# Patient Record
Sex: Female | Born: 1975 | Race: Black or African American | Hispanic: No | Marital: Single | Smoking: Never smoker
Health system: Southern US, Community
[De-identification: ages and names within clinical notes are randomized; demographics above are authoritative.]

## PROBLEM LIST (undated history)

## (undated) DIAGNOSIS — Z789 Other specified health status: Secondary | ICD-10-CM

---

## 2017-08-26 ENCOUNTER — Ambulatory Visit (HOSPITAL_COMMUNITY)
Admission: EM | Admit: 2017-08-26 | Discharge: 2017-08-26 | Discharge status: Against Medical Advice | Payer: BLUE CROSS/BLUE SHIELD | Attending: Family Medicine | Admitting: Family Medicine

## 2017-08-26 ENCOUNTER — Encounter (HOSPITAL_COMMUNITY): Payer: Self-pay

## 2017-08-26 ENCOUNTER — Emergency Department (HOSPITAL_COMMUNITY)
Admission: EM | Admit: 2017-08-26 | Discharge: 2017-08-28 | Disposition: A | Discharge status: Other Acute Inpatient Hospital | Payer: BLUE CROSS/BLUE SHIELD | Attending: Emergency Medicine | Admitting: Emergency Medicine

## 2017-08-26 ENCOUNTER — Emergency Department (HOSPITAL_COMMUNITY): Payer: BLUE CROSS/BLUE SHIELD

## 2017-08-26 DIAGNOSIS — F200 Paranoid schizophrenia: Secondary | ICD-10-CM | POA: Diagnosis present

## 2017-08-26 DIAGNOSIS — K625 Hemorrhage of anus and rectum: Secondary | ICD-10-CM | POA: Diagnosis present

## 2017-08-26 DIAGNOSIS — D509 Iron deficiency anemia, unspecified: Secondary | ICD-10-CM

## 2017-08-26 DIAGNOSIS — F22 Delusional disorders: Secondary | ICD-10-CM

## 2017-08-26 DIAGNOSIS — F209 Schizophrenia, unspecified: Secondary | ICD-10-CM

## 2017-08-26 LAB — CBC WITH DIFFERENTIAL/PLATELET
ABS IMMATURE GRANULOCYTES: 0 10*3/uL (ref 0.0–0.1)
BASOS ABS: 0 10*3/uL (ref 0.0–0.1)
Basophils Relative: 1 %
Eosinophils Absolute: 0.1 10*3/uL (ref 0.0–0.7)
Eosinophils Relative: 2 %
HCT: 34.6 % — ABNORMAL LOW (ref 36.0–46.0)
HEMOGLOBIN: 9.4 g/dL — AB (ref 12.0–15.0)
Immature Granulocytes: 0 %
LYMPHS PCT: 29 %
Lymphs Abs: 1.2 10*3/uL (ref 0.7–4.0)
MCH: 19.5 pg — ABNORMAL LOW (ref 26.0–34.0)
MCHC: 27.2 g/dL — ABNORMAL LOW (ref 30.0–36.0)
MCV: 71.9 fL — ABNORMAL LOW (ref 78.0–100.0)
Monocytes Absolute: 0.3 10*3/uL (ref 0.1–1.0)
Monocytes Relative: 6 %
NEUTROS ABS: 2.6 10*3/uL (ref 1.7–7.7)
NEUTROS PCT: 62 %
Platelets: 521 10*3/uL — ABNORMAL HIGH (ref 150–400)
RBC: 4.81 MIL/uL (ref 3.87–5.11)
RDW: 18.9 % — ABNORMAL HIGH (ref 11.5–15.5)
WBC: 4.1 10*3/uL (ref 4.0–10.5)

## 2017-08-26 LAB — COMPREHENSIVE METABOLIC PANEL
ALBUMIN: 4.9 g/dL (ref 3.5–5.0)
ALT: 15 U/L (ref 0–44)
AST: 31 U/L (ref 15–41)
Alkaline Phosphatase: 30 U/L — ABNORMAL LOW (ref 38–126)
Anion gap: 12 (ref 5–15)
BUN: 11 mg/dL (ref 6–20)
CHLORIDE: 103 mmol/L (ref 98–111)
CO2: 23 mmol/L (ref 22–32)
CREATININE: 0.85 mg/dL (ref 0.44–1.00)
Calcium: 10 mg/dL (ref 8.9–10.3)
GFR calc Af Amer: >60 mL/min (ref >60)
GLUCOSE: 96 mg/dL (ref 70–99)
Potassium: 4.1 mmol/L (ref 3.5–5.1)
Sodium: 138 mmol/L (ref 135–145)
Total Bilirubin: 0.9 mg/dL (ref 0.3–1.2)
Total Protein: 8.1 g/dL (ref 6.5–8.1)

## 2017-08-26 LAB — URINALYSIS, ROUTINE W REFLEX MICROSCOPIC
Bilirubin Urine: NEGATIVE
GLUCOSE, UA: NEGATIVE mg/dL
Hgb urine dipstick: NEGATIVE
Ketones, ur: 80 mg/dL — AB
LEUKOCYTES UA: NEGATIVE
NITRITE: NEGATIVE
PH: 5 (ref 5.0–8.0)
PROTEIN: NEGATIVE mg/dL
Specific Gravity, Urine: 1.025 (ref 1.005–1.030)

## 2017-08-26 LAB — RAPID URINE DRUG SCREEN, HOSP PERFORMED
Amphetamines: NOT DETECTED
BARBITURATES: NOT DETECTED
Benzodiazepines: NOT DETECTED
COCAINE: NOT DETECTED
OPIATES: NOT DETECTED
Tetrahydrocannabinol: NOT DETECTED

## 2017-08-26 LAB — I-STAT BETA HCG BLOOD, ED (MC, WL, AP ONLY)

## 2017-08-26 LAB — ETHANOL: Alcohol, Ethyl (B): <10 mg/dL (ref <10)

## 2017-08-26 LAB — GROUP A STREP BY PCR: GROUP A STREP BY PCR: NOT DETECTED

## 2017-08-26 MED ORDER — ONDANSETRON HCL 4 MG PO TABS: 4.0000 mg | ORAL_TABLET | Freq: Three times a day (TID) | ORAL | Status: DC | PRN

## 2017-08-26 MED ORDER — STERILE WATER FOR INJECTION IJ SOLN
INTRAMUSCULAR | Status: AC
  Administered 2017-08-26: 2.1 mL
  Filled 2017-08-26: qty 10

## 2017-08-26 MED ORDER — IBUPROFEN 400 MG PO TABS: 600.0000 mg | ORAL_TABLET | Freq: Three times a day (TID) | ORAL | Status: DC | PRN

## 2017-08-26 MED ORDER — ZOLPIDEM TARTRATE 5 MG PO TABS: 5.0000 mg | ORAL_TABLET | Freq: Every evening | ORAL | Status: DC | PRN

## 2017-08-26 MED ORDER — ALUM & MAG HYDROXIDE-SIMETH 200-200-20 MG/5ML PO SUSP: 30.0000 mL | Freq: Four times a day (QID) | ORAL | Status: DC | PRN

## 2017-08-26 MED ORDER — LORAZEPAM 1 MG PO TABS: 1.0000 mg | ORAL_TABLET | ORAL | Status: DC | PRN

## 2017-08-26 MED ORDER — NICOTINE 21 MG/24HR TD PT24: 21.0000 mg | MEDICATED_PATCH | Freq: Every day | TRANSDERMAL | Status: DC

## 2017-08-26 MED ORDER — OLANZAPINE 5 MG PO TBDP: 5.0000 mg | ORAL_TABLET | Freq: Three times a day (TID) | ORAL | Status: DC | PRN

## 2017-08-26 MED ORDER — ZIPRASIDONE MESYLATE 20 MG IM SOLR
20.0000 mg | Freq: Once | INTRAMUSCULAR | Status: AC
  Administered 2017-08-26: 20 mg via INTRAMUSCULAR
  Filled 2017-08-26: qty 20

## 2017-08-26 NOTE — ED Notes (Signed)
Unit process and visiting hours discussed with pt and family. Family given extra copy of process and same at pt bedsdie. mFamiily states they understand but pt continues to ask same questions.

## 2017-08-26 NOTE — ED Provider Notes (Signed)
Patient placed in Quick Look pathway, seen and evaluated   Chief Complaint: sore throat and for rectal bleeding and burning and wants resources for therapist/psychiatrist to deal with stressors in her life.   HPI:   Sore throat started yesterday. Chills. Also noted blood when wiping rectum after bowel movement over several times a day. Reports pain to the rectum. No vaginal discharge or bleeding. States she would like to get mental help. States she needs a psychopsychiatrist.   ROS: sore throat, rectal pain, paranoid behavior  Physical Exam:   Gen: No distress  Neuro: Awake and Alert  Skin: Warm    Focused Exam: pt with tangential speech, paranoid behavior, denis SI or HI, but feels like someone is following her   Initiation of care has begun. The patient has been counseled on the process, plan, and necessity for staying for the completion/evaluation, and the remainder of the medical screening examination   Pt refusing blood work or any testing. Sister at bedside requesing pt to be see by psychiatrist. Pt with tangential speech, paranoid, speaking about signs and wires hanging down in the shospital, states aftraid she is going to be taken away from daughter. States she feels like people are spying on her through [employer]. Keeps repeating "I need to get checked for STD."  Sister states she she is worried about her safety and safety of her daughter. States she drove with her daughter all night bc she was running away from someone. States she feels like people spying on her from tv screens and refusing to speak with tts via skype for that reason   IVC pappers taken. Pt danger to herself and others. She refuses me to examine her in this room. Will need more thorough exam.    Jaynie CrumbleKirichenko, Yafet Cline, PA-C 08/26/17 1445    Arby BarrettePfeiffer, Marcy, MD 08/28/17 [phone]
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
## 2017-08-26 NOTE — ED Notes (Signed)
Pt to ct with RN and sitter.

## 2017-08-26 NOTE — ED Notes (Signed)
Pt rouses briefly and agrees to CT scan.

## 2017-08-26 NOTE — Progress Notes (Signed)
Pt accepted [facility] main [facility] Dr. Estill Cottahomas Cornwall is the attending/accepting provider.  Call report to [phone] [facility] notified.    Pt is involuntary and will need to be transported by law enforcement.  Pt is scheduled to [facility] 08/27/17 in the AM.  Wells GuilesSarah Aleksis Jiggetts, LCSW, LCAS Disposition CSW [facility]/TTS [phone]

## 2017-08-26 NOTE — BH Assessment (Addendum)
[facility] Assessment Progress Note  Pt has been served her IVC paperwork and continues to refuse to comply with any protocol (labwork, changing clothes, etc). LE and RN are attempting to get pt to comply and have been for the last 30 or so minutes. TTS assessment unable to be completed at this time. TTS clock stopped.  Johny ShockSamantha M. Ladona Ridgelaylor, MS, NCC, LPCA Counselor

## 2017-08-26 NOTE — ED Notes (Addendum)
Pt returns from CT. Pt remains rousable and moves self back from scanner to stretcher

## 2017-08-26 NOTE — ED Notes (Signed)
Patient attempted to walk out - Patient now IVC'd - [employer] officer brought patient back to triage area. Patient is calm, speaking softly, but requesting to be seen by a doctor and wanting to know why she hasn't been examined yet. Quick look provider speaking with patient at this time.

## 2017-08-26 NOTE — ED Notes (Signed)
Pt continues speaking in circular argument. Dr. Criss AlvineGoldston made aware that pt will not comply with process.

## 2017-08-26 NOTE — ED Notes (Signed)
Pt sleeping at intervals but rouses to voic e ad touch.

## 2017-08-26 NOTE — Progress Notes (Signed)
Pt meets inpatient criteria per Assunta FoundShuvon Rankin, NP. Referral information has been sent to the following hospitals for review: [facility]  [facility]  [facility]  [facility]  [facility]  [facility]  [facility]  [facility]   CSW will continue to assist with placement needs.   Wells GuilesSarah Uniqua Kihn, LCSW, LCAS Disposition CSW [facility] [phone]
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
## 2017-08-26 NOTE — ED Notes (Signed)
Belongings inventoried and placed in locker #2 and valuables sent to [employer]; Patient has $104.67 with Security-Monique,RN
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
## 2017-08-26 NOTE — ED Notes (Signed)
Pt escorted to room Fpod 10. Sister at side. Pt insttructed to change into scrubs and informed after change we would allow family at bedsdie to go over unit policies. Pt agrees then begins asking question of who did papers. Pt also states this is a staged area of the hospital and she wants to go where there are o0ther patients. Pt repeatedly states she will remove clothing but then ask another question. Pt is calm but continues to ask circular questions and states she does not believe this nurse is a Actuaryreal healthcare professional.

## 2017-08-26 NOTE — ED Triage Notes (Signed)
Pt presents for evaluation of sore throat starting last night. Pt reports concern over STD exposure but denies vaginal symptoms. States she had some rectal bleeding when wiping. Denies blood in the toilet.

## 2017-08-26 NOTE — ED Triage Notes (Signed)
Per pt access, pt left 

## 2017-08-26 NOTE — ED Notes (Signed)
Dr. Kathryne SharperGo;dston speaking with family.

## 2017-08-26 NOTE — ED Notes (Signed)
Dr. Goldston at bedside.  

## 2017-08-27 DIAGNOSIS — K625 Hemorrhage of anus and rectum: Secondary | ICD-10-CM | POA: Diagnosis present

## 2017-08-27 DIAGNOSIS — F200 Paranoid schizophrenia: Secondary | ICD-10-CM

## 2017-08-27 DIAGNOSIS — F209 Schizophrenia, unspecified: Secondary | ICD-10-CM

## 2017-08-27 DIAGNOSIS — D509 Iron deficiency anemia, unspecified: Secondary | ICD-10-CM

## 2017-08-27 LAB — CBC
HCT: 31.5 % — ABNORMAL LOW (ref 36.0–46.0)
HEMATOCRIT: 31.5 % — AB (ref 36.0–46.0)
HEMATOCRIT: 31.2 % — AB (ref 36.0–46.0)
HEMOGLOBIN: 8.5 g/dL — AB (ref 12.0–15.0)
HEMOGLOBIN: 8.5 g/dL — AB (ref 12.0–15.0)
Hemoglobin: 8.4 g/dL — ABNORMAL LOW (ref 12.0–15.0)
MCH: 19.5 pg — ABNORMAL LOW (ref 26.0–34.0)
MCH: 19.5 pg — ABNORMAL LOW (ref 26.0–34.0)
MCH: 19.6 pg — AB (ref 26.0–34.0)
MCHC: 27 g/dL — ABNORMAL LOW (ref 30.0–36.0)
MCHC: 26.9 g/dL — ABNORMAL LOW (ref 30.0–36.0)
MCHC: 27 g/dL — AB (ref 30.0–36.0)
MCV: 72.1 fL — AB (ref 78.0–100.0)
MCV: 72.6 fL — AB (ref 78.0–100.0)
MCV: 72.6 fL — ABNORMAL LOW (ref 78.0–100.0)
PLATELETS: 468 10*3/uL — AB (ref 150–400)
Platelets: 436 10*3/uL — ABNORMAL HIGH (ref 150–400)
Platelets: 419 10*3/uL — ABNORMAL HIGH (ref 150–400)
RBC: 4.37 MIL/uL (ref 3.87–5.11)
RBC: 4.3 MIL/uL (ref 3.87–5.11)
RBC: 4.34 MIL/uL (ref 3.87–5.11)
RDW: 18.8 % — ABNORMAL HIGH (ref 11.5–15.5)
RDW: 18.8 % — AB (ref 11.5–15.5)
RDW: 18.9 % — ABNORMAL HIGH (ref 11.5–15.5)
WBC: 3.9 10*3/uL — AB (ref 4.0–10.5)
WBC: 3.8 10*3/uL — AB (ref 4.0–10.5)
WBC: 3.3 10*3/uL — ABNORMAL LOW (ref 4.0–10.5)

## 2017-08-27 LAB — RAPID HIV SCREEN (HIV 1/2 AB+AG)
HIV 1/2 Antibodies: NONREACTIVE
HIV-1 P24 Antigen - HIV24: NONREACTIVE

## 2017-08-27 LAB — HIV ANTIBODY (ROUTINE TESTING W REFLEX): HIV SCREEN 4TH GENERATION: NONREACTIVE

## 2017-08-27 LAB — ABO/RH: ABO/RH(D): B POS

## 2017-08-27 LAB — APTT: aPTT: 28 seconds (ref 24–36)

## 2017-08-27 LAB — PROTIME-INR
INR: 1.14
Prothrombin Time: 14.5 seconds (ref 11.4–15.2)

## 2017-08-27 MED ORDER — ONDANSETRON HCL 4 MG/2ML IJ SOLN: 4.0000 mg | Freq: Three times a day (TID) | INTRAMUSCULAR | Status: DC | PRN

## 2017-08-27 MED ORDER — SODIUM CHLORIDE 0.9 % IV SOLN
INTRAVENOUS | Status: DC
  Administered 2017-08-27: 05:00:00 via INTRAVENOUS

## 2017-08-27 MED ORDER — RISPERIDONE 1 MG PO TABS
1.0000 mg | ORAL_TABLET | Freq: Two times a day (BID) | ORAL | Status: DC
  Administered 2017-08-27 – 2017-08-28 (×3): 1 mg via ORAL
  Filled 2017-08-27 (×3): qty 1

## 2017-08-27 MED ORDER — LORAZEPAM 2 MG/ML IJ SOLN: 0.5000 mg | Freq: Four times a day (QID) | INTRAMUSCULAR | Status: DC | PRN

## 2017-08-27 MED ORDER — PANTOPRAZOLE SODIUM 40 MG IV SOLR
40.0000 mg | Freq: Two times a day (BID) | INTRAVENOUS | Status: DC
  Administered 2017-08-27: 40 mg via INTRAVENOUS
  Filled 2017-08-27 (×2): qty 40

## 2017-08-27 NOTE — ED Notes (Signed)
IVC explained to pt

## 2017-08-27 NOTE — Progress Notes (Signed)
Patient to be admitted to inpatient due to rectal bleeding and dropping hemoglobin stats.  Disposition CSW will mark consult as complete.  Medical staff may resubmit TTS consult if and when it is appropriate and patient is medically cleared.  Timmothy EulerJean T. Kaylyn LimSutter, MSW, LCSWA Disposition Clinical Social Work [phone] (office)

## 2017-08-27 NOTE — ED Notes (Signed)
Pt talking on phone with work, requested to speak with off-duty police officer

## 2017-08-27 NOTE — ED Notes (Signed)
[facility] be medically admitted to the hospital; RN also spoke [facility] to inform them patient will not be transported there-Monique,RN

## 2017-08-27 NOTE — ED Notes (Signed)
ED Provider at bedside. 

## 2017-08-27 NOTE — BH Assessment (Signed)
Met with pt for reassessment. Pt states she does not need any help. She reports she was not seen by a behavioral health counselor for an assessment yesterday. Pt denies having custody issues with her child's father. She reports having only stress that an average single mother would endure. Pt reports missing some work recently & states she wants to thank her employer but denies any employment problems or stressors. Pt denies SI and HI. She denies AVH, and questioned why this kind of question would be asked of her. Pt reports her sister is making up lies about her. Pt admits her mother is concerned about her.  Pt states her mother would not be a helpful person for this writer to get collateral information from because she is a mother and they are always concerned. Pt states she came to ED to get a referral to a psychotherapist to address average single mother stress. Pt was evasive in answering most questions. She denied changes in her sleep but stated she could not say her average number of hours slept.  Pt stated she loses wt every summer & gains every winter but would not estimate recent summer loss amount. Pt is oriented to time, person & place. Pt states she is not paranoid. Pt reported people "may" have been following her recently. When asked why she suspected them of following her, she reported it was their odd behaviors. Pt was evasive in answering questions re: if she thought her phone was tapped & home was bugged.  Shuvon Rankin, NP continues to recommend psychiatric hospitalization.

## 2017-08-27 NOTE — Progress Notes (Signed)
Disposition CSW was contacted by patient's nurse, Hoy RegisterBrianne Davis, RN who related that admitting MD has decided that patient is medically stable and cleared.  CSW called [facility] to see if patient bed was still available and was told it was not and they would have to put patient on their waiting list.  CSW reinstated patient consult so she can be re-evaluated.  Timmothy EulerJean T. Kaylyn LimSutter, MSW, LCSWA Disposition Clinical Social Work [phone])

## 2017-08-27 NOTE — H&P (Signed)
History and Physical  [facility] ZOX:[id number] DOB: 04-08-1975 DOA: 08/26/2017  PCP: Patient, No Pcp Per   Chief Complaint: sore throat  HPI:  Patient interviewed with bedside nurse Hoy RegisterBrianne Davis present 100% of interaction.  Per chart review 42 year old woman presented 8/20 1 in the afternoon with multiple complaints including sore throat, STD, rectal bleeding, desire to see psychiatrist for stressors in her life.  Initially refused blood work, per EDP documentation patient with tangential speech, paranoid, hallucinating, reporting people were spying on her through electronics.  Again per chart, it is documented that the patient's sister was worried about her safety.  IVC papers were drawn up and the patient was felt to be a danger to herself and others.  She refused initial examination.  A second EDP also evaluated the patient and noted her speech to be tangential, with paranoid and delusional thought content.  She did not allow an examination by second EDP.  Was evaluated by psychiatry team with diagnosis of paranoid schizophrenia and recommendation for inpatient treatment.  [facility].  Only abnormality noted was anemia and plans were made to repeat to exclude acute drop.  Third EDP noted modest change in hemoglobin and called hospitalist team for admission, patient was accepted by my partner.  However subsequent hemoglobins have remained stable.  Per bedside RN in the emergency department, the patient has been calm but does not want any further evaluation..   On my interview with the patient she denies any complaints, she denies abdominal pain specifically, bleeding.  Her review of systems is negative and she would prefer any work-up to be done as an outpatient.  She steadfastly refuses examination of any kind including basic heart and lung exam.  "I am fine."  She denies suicidal or homicidal ideation, she denies hallucinations.  She reports psychiatric concerns as  being "made up".  ED Course: Afebrile, vital signs stable.  Treated with Geodon.  Review of Systems:  Negative for fever, visual changes, sore throat, rash, new muscle aches, chest pain, SOB, dysuria, bleeding, n/v/abdominal pain.  History reviewed. No pertinent past medical history.  History reviewed. No pertinent surgical history.   has no tobacco, alcohol, and drug history on file. Mobility: Ambulatory  No Known Allergies  No family history on file.  Noncompliant with questioning Prior to Admission medications   Not on File    Physical Exam: Vitals:   08/27/17 0309 08/27/17 0615  BP: 120/81 125/88  Pulse: (!) 111 84  Resp:    Temp:  98.7 F (37.1 C)  SpO2:  99%    Constitutional:   . Appears calm and comfortable Eyes:  Marland Kitchen. Appear grossly unremarkable ENMT:  . grossly normal hearing  Neck:  . Appears grossly unremarkable Respiratory:  . Respiratory effort appears normal. Cardiovascular:  . Refuses examination Abdomen:  . Refuses examination Musculoskeletal:  . Refuses examination but does move all limbs to request. Skin:  . Refuses examination Neurologic:  . Refuses examination Psychiatric:  . Mental status o Mood difficult to assess, affect odd o Oriented to person, place, time . judgment and insight appear impaired  As the patient refused any examination of any kind, no physical contact whatsoever was made with patient. Hoy RegisterBrianne Davis present for entire interview.  I have personally reviewed following labs and imaging studies  Labs:   Hemoglobin 9.4, 8.5, 8.4, 8.5.  Microcytosis noted.  WBC 3.3-4.1.  Platelets within normal limits.  HIV nonreactive  Urine pregnancy negative  Imaging studies:  CT head no acute findings  Medical tests:   EKG independently reviewed: Sinus rhythm, no acute changes.  Nonspecific ST changes.   Principal Problem:   Paranoid schizophrenia (HCC) Active Problems:   Microcytic  anemia   Assessment/Plan Paranoid schizophrenia per psychiatry evaluation. S/p Geodon in ED may account for calmness. --Inpatient treatment as recommended by psychiatry.  Microcytic anemia, stable over several blood draws, almost certainly chronic in nature from menses.  Completely asymptomatic.  Normotensive.  Normal heart rate.  Reports mostly regular menses, not currently menstruating.  Refuses any examination.  Denies rectal bleeding. --She has a microcytic anemia, suspect related to menstrual cycle.  No previous blood work available, she does follow with her GYN but cannot recall the name or the name of the practice and there is no blood work available in care everywhere. --Subsequent hemoglobins have been stable.  Hemoglobin can vary up to 1 g on subsequent testing and the initial change from 9.4-8.5 is not significant given the clinical picture. --Given the fact that she is asymptomatic, normotensive, not tachycardic and refuses physical examination as well as further testing, I do not think it is reasonable to pursue any further work-up.  In fact regardless this patient is stable from a medical standpoint for outpatient follow-up and should follow-up with her PCP or GYN within the next 2 weeks.  Empiric trial of oral iron at some point would be recommended.  Reported request for STD testing.  I discussed what this would entail with the patient including pelvic examination.  She wishes to defer this to the outpatient setting.  I see no compelling reason to pursue this during this evaluation.  As documented above, notwithstanding the limitations I have described, the patient appears stable from a medical perspective and further evaluation can be pursued in the outpatient setting.  The most immediate concern is paranoid schizophrenia and I believe it is in the patient's best interest to proceed with inpatient psychiatric treatment.  Unfortunately at this point patient has lost psychiatric bed.  Will continue observation pending CSW securement of new psychiatric bed.  Severity of Illness: The appropriate patient status for this patient is OBSERVATION. Observation status is judged to be reasonable and necessary in order to provide the required intensity of service to ensure the patient's safety. The patient's presenting symptoms, physical exam findings, and initial radiographic and laboratory data in the context of their medical condition is felt to place them at decreased risk for further clinical deterioration. Furthermore, it is anticipated that the patient will be medically stable for discharge from the hospital within 2 midnights of admission.    DVT prophylaxis: ambulation Code Status: Full Family Communication:  Consults called: psychiatory    Time spent: 17 minutes  Brendia Sacks, MD  [facility] Direct contact: [phone] --Via amion app OR  --[email or web address]; password [email or web address]  7PM-7AM contact night coverage as above  08/27/2017, 11:45 AM

## 2017-08-27 NOTE — ED Notes (Signed)
Hospitalist at bedside 

## 2017-08-27 NOTE — ED Notes (Signed)
Pt wants to discuss her child's custody. Pt needs to contact family member and wants to discuss w/ counselor

## 2017-08-27 NOTE — ED Notes (Signed)
Pt's mother contact [name] [phone]),

## 2017-08-27 NOTE — Progress Notes (Signed)
Pt continues to meet inpatient criteria. She has been referred to the following hospitals for review: [facility]  [facility]  [facility]  [facility]  [facility]  [facility]  [facility]  [facility]   CSW will continue to assist with placement needs  Bethanne GingerSarah Adream Parzych, LCSW, LCAS Disposition CSW [facility] [phone]

## 2017-08-27 NOTE — Care Management (Signed)
This is a no charge note  Pending admission per Dr. Preston FleetingGlick  42 year old lady without significant past medical history, who presents with multiple complaints, including rectal bleeding, sore throat, stress life.  Patient is paranoid.  TTS was consulted. She noted blood when wiping rectum after bowel movement. She also wants to be tested for STD. No vaginal discharge or bleeding.  No baseline hemoglobin available.  Hemoglobin dropped from a 9.4 to 8.5 in 8 hours in ED.  Rapid stress test negative.  Currently hemodynamically stable. Pt is placed in tele bed for obs.   Kelsey HarpXilin Tyja Gortney, MD  [facility] Pager [phone]  If 7PM-7AM, please contact night-coverage [email or web address] Password [email or web address] 08/27/2017, 3:45 AM
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
## 2017-08-27 NOTE — ED Notes (Signed)
Visitor here for pt, security en route to wand \

## 2017-08-27 NOTE — ED Notes (Signed)
Admitting Paged about status of admit order.

## 2017-08-27 NOTE — ED Notes (Addendum)
Pt believes Np to come reassess her in person, I contacted Shuvon Rankin who is talking to pt on the phone

## 2017-08-27 NOTE — ED Notes (Addendum)
Pt's mother at bedside for visitation.

## 2017-08-27 NOTE — ED Notes (Signed)
[facility]

## 2017-08-27 NOTE — ED Notes (Signed)
Pt Ph call #1 w/ sister [name] & Pt's dtr

## 2017-08-28 ENCOUNTER — Encounter (HOSPITAL_COMMUNITY): Payer: Self-pay | Admitting: *Deleted

## 2017-08-28 ENCOUNTER — Other Ambulatory Visit: Payer: Self-pay

## 2017-08-28 ENCOUNTER — Inpatient Hospital Stay (HOSPITAL_COMMUNITY)
Admission: AD | Admit: 2017-08-28 | Discharge: 2017-09-04 | DRG: 881 | Disposition: A | Discharge status: Home / Self Care | Payer: BLUE CROSS/BLUE SHIELD | Attending: Psychiatry | Admitting: Psychiatry

## 2017-08-28 DIAGNOSIS — F329 Major depressive disorder, single episode, unspecified: Principal | ICD-10-CM | POA: Diagnosis present

## 2017-08-28 DIAGNOSIS — D509 Iron deficiency anemia, unspecified: Secondary | ICD-10-CM | POA: Diagnosis present

## 2017-08-28 DIAGNOSIS — R451 Restlessness and agitation: Secondary | ICD-10-CM | POA: Diagnosis not present

## 2017-08-28 DIAGNOSIS — Z560 Unemployment, unspecified: Secondary | ICD-10-CM | POA: Diagnosis not present

## 2017-08-28 DIAGNOSIS — F200 Paranoid schizophrenia: Secondary | ICD-10-CM | POA: Diagnosis not present

## 2017-08-28 DIAGNOSIS — F209 Schizophrenia, unspecified: Secondary | ICD-10-CM | POA: Diagnosis not present

## 2017-08-28 DIAGNOSIS — G4700 Insomnia, unspecified: Secondary | ICD-10-CM | POA: Diagnosis not present

## 2017-08-28 DIAGNOSIS — D649 Anemia, unspecified: Secondary | ICD-10-CM | POA: Diagnosis not present

## 2017-08-28 DIAGNOSIS — F410 Panic disorder [episodic paroxysmal anxiety] without agoraphobia: Secondary | ICD-10-CM | POA: Diagnosis present

## 2017-08-28 DIAGNOSIS — G259 Extrapyramidal and movement disorder, unspecified: Secondary | ICD-10-CM | POA: Diagnosis not present

## 2017-08-28 DIAGNOSIS — D6489 Other specified anemias: Secondary | ICD-10-CM | POA: Diagnosis not present

## 2017-08-28 DIAGNOSIS — F419 Anxiety disorder, unspecified: Secondary | ICD-10-CM | POA: Diagnosis not present

## 2017-08-28 HISTORY — DX: Other specified health status: Z78.9

## 2017-08-28 MED ORDER — LORAZEPAM 1 MG PO TABS: 2.0000 mg | ORAL_TABLET | Freq: Four times a day (QID) | ORAL | Status: DC | PRN

## 2017-08-28 MED ORDER — HALOPERIDOL LACTATE 5 MG/ML IJ SOLN: 5.0000 mg | Freq: Four times a day (QID) | INTRAMUSCULAR | Status: DC | PRN

## 2017-08-28 MED ORDER — DIPHENHYDRAMINE HCL 50 MG/ML IJ SOLN: 50.0000 mg | Freq: Four times a day (QID) | INTRAMUSCULAR | Status: DC | PRN

## 2017-08-28 MED ORDER — DIPHENHYDRAMINE HCL 25 MG PO CAPS: 25.0000 mg | ORAL_CAPSULE | Freq: Four times a day (QID) | ORAL | Status: DC | PRN

## 2017-08-28 MED ORDER — HYDROXYZINE HCL 25 MG PO TABS: 25.0000 mg | ORAL_TABLET | Freq: Three times a day (TID) | ORAL | Status: DC | PRN

## 2017-08-28 MED ORDER — LORAZEPAM 2 MG/ML IJ SOLN: 2.0000 mg | Freq: Four times a day (QID) | INTRAMUSCULAR | Status: DC | PRN

## 2017-08-28 MED ORDER — ACETAMINOPHEN 325 MG PO TABS: 650.0000 mg | ORAL_TABLET | Freq: Four times a day (QID) | ORAL | Status: DC | PRN

## 2017-08-28 MED ORDER — ALUM & MAG HYDROXIDE-SIMETH 200-200-20 MG/5ML PO SUSP: 30.0000 mL | ORAL | Status: DC | PRN

## 2017-08-28 MED ORDER — TRAZODONE HCL 50 MG PO TABS: 50.0000 mg | ORAL_TABLET | Freq: Every evening | ORAL | Status: DC | PRN

## 2017-08-28 MED ORDER — HYDROXYZINE HCL 50 MG PO TABS: 50.0000 mg | ORAL_TABLET | Freq: Four times a day (QID) | ORAL | Status: DC | PRN

## 2017-08-28 MED ORDER — BENZTROPINE MESYLATE 1 MG/ML IJ SOLN: 1.0000 mg | Freq: Two times a day (BID) | INTRAMUSCULAR | Status: DC | PRN

## 2017-08-28 MED ORDER — MAGNESIUM HYDROXIDE 400 MG/5ML PO SUSP: 30.0000 mL | Freq: Every day | ORAL | Status: DC | PRN

## 2017-08-28 MED ORDER — HALOPERIDOL 5 MG PO TABS: 5.0000 mg | ORAL_TABLET | Freq: Four times a day (QID) | ORAL | Status: DC | PRN

## 2017-08-28 MED ORDER — DIPHENHYDRAMINE HCL 25 MG PO CAPS: 50.0000 mg | ORAL_CAPSULE | Freq: Four times a day (QID) | ORAL | Status: DC | PRN

## 2017-08-28 MED ORDER — BENZTROPINE MESYLATE 1 MG PO TABS: 1.0000 mg | ORAL_TABLET | Freq: Two times a day (BID) | ORAL | Status: DC | PRN

## 2017-08-28 NOTE — ED Notes (Signed)
[facility]   By Kandace Parkinsakia starkes,NP & Earvin Hansenhristopher [place],MD

## 2017-08-28 NOTE — ED Notes (Signed)
Pt gave RN a letter she wants to discuss w/ her sup., Letter attached to Pt's clipboard F10

## 2017-08-28 NOTE — ED Notes (Signed)
Pt explained she was pacing, trying to keep herself awake to "gather her thoughts." Pen and paper provided so pt could write thoughts down.

## 2017-08-28 NOTE — ED Notes (Signed)
[employer] notified, Pt needs transport

## 2017-08-28 NOTE — ED Notes (Signed)
Pt  Ph Call #1

## 2017-08-28 NOTE — ED Notes (Signed)
  [employer] to transport Pt

## 2017-08-28 NOTE — Progress Notes (Signed)
[facility] Bed [facility]2 NP is the accepting provider.  Dr. is the attending provider.  Call report to [phone]  Bri, [facility] @ [facility] notified.   Pt is IVC   Pt may be transported by [employer] [facility]  [facility] as [facility] can be arranged.  Kelsey EulerJean T. Kaylyn LimSutter, MSW, LCSWA Disposition Clinical Social Work [phone] (cell) [phone] (office)

## 2017-08-28 NOTE — ED Notes (Signed)
Pt walking in hallway.

## 2017-08-28 NOTE — Tx Team (Signed)
Initial Treatment Plan 08/28/2017 1:34 PM [facility] ZOX:[id number]    PATIENT STRESSORS: Marital or family conflict Occupational concerns   PATIENT STRENGTHS: Average or above average intelligence Communication skills General fund of knowledge Supportive family/friends   PATIENT IDENTIFIED PROBLEMS: "I'm not admitted, I'm not even a patient here"                     DISCHARGE CRITERIA:  Ability to meet basic life and health needs Adequate post-discharge living arrangements Improved stabilization in mood, thinking, and/or behavior Medical problems require only outpatient monitoring Motivation to continue treatment in a less acute level of care Need for constant or close observation no longer present Reduction of life-threatening or endangering symptoms to within safe limits Safe-care adequate arrangements made Verbal commitment to aftercare and medication compliance  PRELIMINARY DISCHARGE PLAN: Attend aftercare/continuing care group Attend PHP/IOP Outpatient therapy Return to previous living arrangement Return to previous work or school arrangements  PATIENT/FAMILY INVOLVEMENT: This treatment plan has been presented to and reviewed with the patient, [name].  The patient and family have been given the opportunity to ask questions and make suggestions.  Carlisle CaterErika C Jayvyn Haselton, RN 08/28/2017, 1:34 PM

## 2017-08-28 NOTE — Progress Notes (Signed)
Pt. Is a 42 year old female IVC for paranoid behavior.  [facility] and when patient attempted to leave IVC paperwork was done.  Pt. States that her sister who she doesn't have much of a relationship with began telling staff that she felt that the patient was a danger to herself and her child.  Pt. Is uncooperative, rapid, pressured and tangential during assessment, she refused to sign any paperwork stating, "I'm not admitted here, I'm not even a patient and how can they give me a diagnosis of schizophrenia if I haven't seen a doctor".  Pt denies any history of abuse, substance use or SI/HI/AVH.  Pt. Did comply with skin assessment and was oriented to the unit without incident and with safety maintained.

## 2017-08-28 NOTE — Progress Notes (Signed)
Pt is observed in the room, seen pacing in/out of room. Pt appears agitated/irritable/paranoid/suspicious in affect and mood.Pt denies SI/HI/AVH/Pain at this time. Pt does not want to program at this time. Pt states "I don't need to be here; There is nothing wrong with me". Pt was encourage to speak with provider tomorrow. Support provided. PRNs offered; Pt declined. Will continue with POC.

## 2017-08-29 DIAGNOSIS — F419 Anxiety disorder, unspecified: Secondary | ICD-10-CM

## 2017-08-29 DIAGNOSIS — F209 Schizophrenia, unspecified: Secondary | ICD-10-CM

## 2017-08-29 LAB — IRON AND TIBC
IRON: 15 ug/dL — AB (ref 28–170)
Saturation Ratios: 3 % — ABNORMAL LOW (ref 10.4–31.8)
TIBC: 582 ug/dL — AB (ref 250–450)
UIBC: 567 ug/dL

## 2017-08-29 LAB — RETICULOCYTES
RBC.: 4.58 MIL/uL (ref 3.87–5.11)
RETIC COUNT ABSOLUTE: 36.6 10*3/uL (ref 19.0–186.0)
Retic Ct Pct: 0.8 % (ref 0.4–3.1)

## 2017-08-29 LAB — FOLATE: FOLATE: 13.9 ng/mL (ref >5.9)

## 2017-08-29 LAB — FERRITIN: FERRITIN: 5 ng/mL — AB (ref 11–307)

## 2017-08-29 LAB — PREGNANCY, URINE: PREG TEST UR: NEGATIVE

## 2017-08-29 LAB — VITAMIN B12: Vitamin B-12: 1121 pg/mL — ABNORMAL HIGH (ref 180–914)

## 2017-08-29 MED ORDER — LORAZEPAM 1 MG PO TABS: 1.0000 mg | ORAL_TABLET | Freq: Four times a day (QID) | ORAL | Status: DC | PRN

## 2017-08-29 MED ORDER — MIRTAZAPINE 7.5 MG PO TABS
7.5000 mg | ORAL_TABLET | Freq: Every day | ORAL | Status: DC
  Administered 2017-08-29 – 2017-08-30 (×2): 7.5 mg via ORAL
  Filled 2017-08-29 (×6): qty 1

## 2017-08-29 MED ORDER — LORAZEPAM 2 MG/ML IJ SOLN: 1.0000 mg | Freq: Four times a day (QID) | INTRAMUSCULAR | Status: DC | PRN

## 2017-08-29 MED ORDER — HYDROXYZINE HCL 25 MG PO TABS: 25.0000 mg | ORAL_TABLET | Freq: Four times a day (QID) | ORAL | Status: DC | PRN

## 2017-08-29 MED ORDER — RISPERIDONE 0.5 MG PO TABS
0.5000 mg | ORAL_TABLET | Freq: Two times a day (BID) | ORAL | Status: DC
  Administered 2017-08-29: 0.5 mg via ORAL
  Filled 2017-08-29 (×5): qty 1

## 2017-08-29 MED ORDER — RISPERIDONE 1 MG PO TABS
1.0000 mg | ORAL_TABLET | Freq: Every day | ORAL | Status: DC
  Administered 2017-08-29 – 2017-08-30 (×2): 1 mg via ORAL
  Filled 2017-08-29 (×5): qty 1

## 2017-08-29 MED ORDER — ENSURE ENLIVE PO LIQD
237.0000 mL | Freq: Two times a day (BID) | ORAL | Status: DC
  Administered 2017-08-29 – 2017-09-01 (×6): 237 mL via ORAL

## 2017-08-29 MED ORDER — RISPERIDONE 0.5 MG PO TABS
0.5000 mg | ORAL_TABLET | ORAL | Status: DC
  Administered 2017-08-30 – 2017-08-31 (×2): 0.5 mg via ORAL
  Filled 2017-08-29 (×4): qty 1

## 2017-08-29 NOTE — H&P (Addendum)
Psychiatric Admission Assessment Adult  Patient Identification: Shanel Prazak MRN:  [id number] Date of Evaluation:  08/29/2017 Chief Complaint:  PARANOID SCIZOPHRENIA Principal Diagnosis: Schizophrenia ([place]) Diagnosis:   Patient Active Problem List   Diagnosis Date Noted  . Microcytic anemia [D50.9] 08/27/2017  . Schizophrenia ([place]) [F20.9] 08/27/2017   History of Present Illness: per assessment note:Kimberlyann Schneck is a 42 y.o. female in ED due to evaluation of sore throat. During initial assessment with PA, it was noted that pt was displaying extremely paranoid behaviors. She refused to get lab work completed or change into scrubs. PA spoke to sister, who accompanied pt to the ED and, based on that conversation, a TTS assessment was requested. Pt refuses to give any information to this clinician. She says that this clinician is not real and feels that information is being collected to help other people, but not her.  Clinician was able to speak to pt's sister, Aldean Jewett ([phone]), and mother, Loel Lofty [phone]), for collateral information. They shared that pt has been under a lot of pressure and stress due to custody issues with her ex-husband. Karlene Einstein states that she hasn't been really close to her sister for awhile so she just started noticing changes in her over the last couple of weeks but suspects the paranoia has been going on for much longer. Pt and sister both live in [place] and mom lives in [place]. Pt works in [place]. Pt is convinced that she's being followed everywhere she goes. She is also convinced that her phones are being tapped and her house is bugged. When she speaks to her sister on the phone, she speaks in code.  Pt drove to her mother's house with her 32 yr old daughter @2am  this morning for fear of being followed. Pt is not sleeping or eating, convinced that her food is being poisoned. She also has her daughter convinced to the point that she has not been  eating or sleeping either. There is no knowledge of drug use or prior psychiatric issues.   Evaluation; Patient was evaluated  by MD and NP. Patient present paranoid, guarded and flat. Patient was agreeable to continue the medication that was iniated in the hospital for mood and feeling of being overwhelmed. collateral information was reviewed. As pateint continues to deny suicidal or homicidal ideations. See SRA. Support, encouragement and reassurance was provided.   Associated Signs/Symptoms: Depression Symptoms:  difficulty concentrating, anxiety, panic attacks, weight loss, (Hypo) Manic Symptoms:  Distractibility, Irritable Mood, Anxiety Symptoms:  Excessive Worry, Psychotic Symptoms:  Paranoia, PTSD Symptoms: NA Total Time spent with patient: 20 minutes  Past Psychiatric History:  Yamira denied previous inpatient/outpatiet admission or that she is followed by psychiatrist.   Is the patient at risk to self? Yes.    Has the patient been a risk to self in the past 6 months? Yes.    Has the patient been a risk to self within the distant past? No.  Is the patient a risk to others? No.  Has the patient been a risk to others in the past 6 months? No.  Has the patient been a risk to others within the distant past? No.   Prior Inpatient Therapy:   Prior Outpatient Therapy:    Alcohol Screening: 1. How often do you have a drink containing alcohol?: Never 2. How many drinks containing alcohol do you have on a typical day when you are drinking?: 1 or 2 3. How often do you have six or more drinks on  one occasion?: Never AUDIT-C Score: 0 4. How often during the last year have you found that you were not able to stop drinking once you had started?: Never 5. How often during the last year have you failed to do what was normally expected from you becasue of drinking?: Never 6. How often during the last year have you needed a first drink in the morning to get yourself going after a heavy  drinking session?: Never 7. How often during the last year have you had a feeling of guilt of remorse after drinking?: Never 8. How often during the last year have you been unable to remember what happened the night before because you had been drinking?: Never 9. Have you or someone else been injured as a result of your drinking?: No 10. Has a relative or friend or a doctor or another health worker been concerned about your drinking or suggested you cut down?: No Alcohol Use Disorder Identification Test Final Score (AUDIT): 0 Intervention/Follow-up: AUDIT Score <7 follow-up not indicated Substance Abuse History in the last 12 months:  No. was denied Consequences of Substance Abuse: NA Previous Psychotropic Medications: No  Psychological Evaluations: No  Past Medical History:  Past Medical History:  Diagnosis Date  . Medical history non-contributory    History reviewed. No pertinent surgical history. Family History: History reviewed. No pertinent family history. Family Psychiatric  History: Tobacco Screening: Have you used any form of tobacco in the last 30 days? (Cigarettes, Smokeless Tobacco, Cigars, and/or Pipes): No Social History:  Social History   Substance and Sexual Activity  Alcohol Use Not Currently     Social History   Substance and Sexual Activity  Drug Use Never    Additional Social History:                           Allergies:  No Known Allergies Lab Results: No results found for this or any previous visit (from the past 48 hour(s)).  Blood Alcohol level:  Lab Results  Component Value Date   ETH <10 34/19/3790    Metabolic Disorder Labs:  No results found for: HGBA1C, MPG No results found for: PROLACTIN No results found for: CHOL, TRIG, HDL, CHOLHDL, VLDL, LDLCALC  Current Medications: Current Facility-Administered Medications  Medication Dose Route Frequency Provider Last Rate Last Dose  . acetaminophen (TYLENOL) tablet 650 mg  650 mg Oral  Q6H PRN Nanci Pina, FNP      . alum & mag hydroxide-simeth (MAALOX/MYLANTA) 200-200-20 MG/5ML suspension 30 mL  30 mL Oral Q4H PRN Starkes, Takia S, FNP      . benztropine (COGENTIN) tablet 1 mg  1 mg Oral BID PRN Pennelope Bracken, MD       Or  . benztropine mesylate (COGENTIN) injection 1 mg  1 mg Intramuscular BID PRN Pennelope Bracken, MD      . feeding supplement (ENSURE ENLIVE) (ENSURE ENLIVE) liquid 237 mL  237 mL Oral BID BM Derrill Center, NP   237 mL at 08/29/17 1306  . haloperidol (HALDOL) tablet 5 mg  5 mg Oral Q6H PRN Pennelope Bracken, MD       Or  . haloperidol lactate (HALDOL) injection 5 mg  5 mg Intramuscular Q6H PRN Pennelope Bracken, MD      . hydrOXYzine (ATARAX/VISTARIL) tablet 25 mg  25 mg Oral Q6H PRN Deshanna Kama, Myer Peer, MD      . LORazepam (ATIVAN) tablet 1 mg  1 mg Oral Q6H PRN Catrice Zuleta, Myer Peer, MD       Or  . LORazepam (ATIVAN) injection 1 mg  1 mg Intramuscular Q6H PRN Harrold Fitchett A, MD      . magnesium hydroxide (MILK OF MAGNESIA) suspension 30 mL  30 mL Oral Daily PRN Starkes, Takia S, FNP      . mirtazapine (REMERON) tablet 7.5 mg  7.5 mg Oral QHS Derrill Center, NP      . Derrill Memo ON 08/30/2017] risperiDONE (RISPERDAL) tablet 0.5 mg  0.5 mg Oral BH-q7a Abagayle Klutts A, MD      . risperiDONE (RISPERDAL) tablet 1 mg  1 mg Oral QHS Malikiah Debarr, Myer Peer, MD       PTA Medications: No medications prior to admission.    Musculoskeletal: Strength & Muscle Tone: within normal limits Gait & Station: normal Patient leans: N/A  Psychiatric Specialty Exam: Physical Exam  Nursing note and vitals reviewed. Constitutional: She appears well-developed.  Psychiatric: She has a normal mood and affect. Her behavior is normal.    Review of Systems  Psychiatric/Behavioral: Negative for depression, substance abuse and suicidal ideas. The patient is nervous/anxious.   All other systems reviewed and are negative.   Blood pressure 110/87,  pulse (!) 103, temperature 98 F (36.7 C), temperature source Oral, resp. rate 18, height 5' 4"  (1.626 m), SpO2 100 %.There is no height or weight on file to calculate BMI.  General Appearance: Disheveled  Eye Contact:  Good  Speech:  Clear and Coherent  Volume:  Normal  Mood:  Anxious and Dysphoric  Affect:  Constricted  Thought Process:  Descriptions of Associations: Loose  Orientation:  Full (Time, Place, and Person)  Thought Content:  Paranoid Ideation and Rumination  Suicidal Thoughts:  No  Homicidal Thoughts:  No  Memory:  Immediate;   Fair Recent;   Fair Remote;   Fair  Judgement:  Fair  Insight:  Lacking  Psychomotor Activity:  Restlessness  Concentration:  Concentration: Fair  Recall:  [employer] of Knowledge:  Fair  Language:  Fair  Akathisia:  No  Handed:  Right  AIMS (if indicated):     Assets:  Communication Skills Desire for Improvement Resilience Social Support  ADL's:  Intact  Cognition:  WNL  Sleep:  Number of Hours: 5.5    Treatment Plan Summary: Daily contact with patient to assess and evaluate symptoms and progress in treatment and Medication management    Start Risperdal 0.83m po q day and 142mPo QHS for mood stabilization Start Remeron 7.32m89mO QHS insomnia   Will continue to monitor vitals ,medication compliance and treatment side effects while patient is here.  Reviewed labs ,BAL - , UDS -  Pending A1C, TSH, Vit B 12 and prolactin  CSW will start working on disposition.  Patient to participate in therapeutic milieu  Observation Level/Precautions:  15 minute checks  Laboratory:  CBC Chemistry Profile UDS UA  Psychotherapy:  individual and group session  Medications:  See SRA  Consultations:  CSW and Psychiatrist   Discharge Concerns:  Safety, stabilization, and risk of access to medication and medication stabilization   Estimated LOS: 5-7days  Other:     Physician Treatment Plan for Primary Diagnosis: [facility]ia [place] [place]  Goal(s): Improvement in symptoms so as ready for discharge  Short Term Goals: Ability to identify changes in lifestyle to reduce recurrence of condition will improve, Ability to verbalize feelings will improve, Ability to maintain clinical measurements within normal  limits will improve and Compliance with prescribed medications will improve  Physician Treatment Plan for Secondary Diagnosis: Principal Problem:   Schizophrenia ([place])  Long Term Goal(s): Improvement in symptoms so as ready for discharge  Short Term Goals: Ability to verbalize feelings will improve, Ability to demonstrate self-control will improve, Ability to identify and develop effective coping behaviors will improve and Compliance with prescribed medications will improve  I certify that inpatient services furnished can reasonably be expected to improve the patient's condition.    Derrill Center, NP 8/24/20192:27 PM  I have discussed case with NP and have met with patient  Agree with NP note and assessment  42 year old female, divorced, has 28 year old daughter, unemployed . Lives in [place] Patient presents as fair historian, states she has been feeling " overworked" and describes feeling overwhelmed due to limited social support and limited resources . States she went to her mother's for help/assistance but that it did not go well . Reports she decided to come to [facility] and presented to ED  Initially reporting physical symptoms ( sore throat, rectal bleeding). Patient presented guarded, paranoid,and as per ED notes, sister and mother provided collateral information reporting that patient has been paranoid, feeling she is being followed, convinced her phone is being tapped, attempting to communicate in code with her sister, not eating with concerns food is poisoned, not sleeping. Currently patient presents guarded, fearful, states that unit soap is making her feel sick and expressing concerns about the ice in her water . She  denies depression, but states she has been overwhelmed. Reports she has not been eating well because " I don't have a lot of money". Denies suicidal ideations. States her child is now in the custody of the child's father. Denies prior psychiatric admissions , denies history of depression, mania or of psychosis. Denies history of suicide attempts.  Was not taking medications prior to admission. Denies drug or alcohol abuse - admission BAL negative, admission UDS negative . 8/21 EKG  NSR , QTc 457   Dx- Psychosis, Unspecified   Plan- Inpatient admission. We discussed medication options- she did agree to take Risperidone, which she states she received in ED and felt it helped her sleep better. Start Risperidone 0.5 mgrs QAM and 1 mgr QHS   Haldol/Ativan as agitation protocol. Obtain routine labs . Encourage PO fluids

## 2017-08-29 NOTE — Progress Notes (Signed)
DAR NOTE: Patient presents with anxious affect, depressed and labile  mood. Pt has been pacing on the hallway most of time, kept asking the same question over and over. Very argumentative and suspicious about everything. Pt has not been eating or drinking, pt encouraged to eat and drink, offered a pitcher of gatorade. Pt at some point appeared groggy, and unsteady on her feet. With encouragement, pt been able to drink and ate lunch, more staedy on her feet, and clearer  thoughts. Denies pain, auditory and visual hallucinations.  Rates depression at 0, hopelessness at 0, and anxiety at 0.  Maintained on routine safety checks.  Medications given as prescribed.  Support and encouragement offered as needed.  Attended group and participated, will continue to monitor.

## 2017-08-29 NOTE — [facility] Suicide Risk Assessment (Signed)
[facility] Admission Suicide Risk Assessment   Nursing information obtained from:  Patient Demographic factors:  Living alone, Divorced or widowed Current Mental Status:  NA Loss Factors:  NA Historical Factors:  Impulsivity Risk Reduction Factors:  Responsible for children under 41 years of age, Positive social support, Positive therapeutic relationship  Total Time spent with patient: 45 minutes Principal Problem:  Psychosis Diagnosis:   Patient Active Problem List   Diagnosis Date Noted  . Microcytic anemia [D50.9] 08/27/2017  . Paranoid schizophrenia (HCC) [F20.0] 08/27/2017   Subjective Data:   Continued Clinical Symptoms:  Alcohol Use Disorder Identification Test Final Score (AUDIT): 0 The "Alcohol Use Disorders Identification Test", Guidelines for Use in Primary Care, Second Edition.  World Science writer [facility]). Score between 0-7:  no or low risk or alcohol related problems. Score between 8-15:  moderate risk of alcohol related problems. Score between 16-19:  high risk of alcohol related problems. Score 20 or above:  warrants further diagnostic evaluation for alcohol dependence and treatment.   CLINICAL FACTORS:  42 year old female, divorced, has 61 year old daughter, unemployed . Lives in [place] Patient presents as fair historian, states she has been feeling " overworked" and describes feeling overwhelmed due to limited social support and limited resources . States she went to her mother's for help/assistance but that it did not go well . Reports she decided to come to [facility] and presented to ED  Initially reporting physical symptoms ( sore throat, rectal bleeding). Patient presented guarded, paranoid,and as per ED notes, sister and mother provided collateral information reporting that patient has been paranoid, feeling she is being followed, convinced her phone is being tapped, attempting to communicate in code with her sister, not eating with concerns food is poisoned,  not sleeping. Currently patient presents guarded, fearful, states that unit soap is making her feel sick and expressing concerns about the ice in her water . She denies depression, but states she has been overwhelmed. Reports she has not been eating well because " I don't have a lot of money". Denies suicidal ideations. States her child is now in the custody of the child's father. Denies prior psychiatric admissions , denies history of depression, mania or of psychosis. Denies history of suicide attempts.  Was not taking medications prior to admission. Denies drug or alcohol abuse - admission BAL negative, admission UDS negative . 8/21 EKG  NSR , QTc 457   Dx- Psychosis, Unspecified   Plan- Inpatient admission. We discussed medication options- she did agree to take Risperidone, which she states she received in ED and felt it helped her sleep better. Start Risperidone 0.5 mgrs QAM and 1 mgr QHS   Haldol/Ativan as agitation protocol. Obtain routine labs . Encourage PO fluids     Musculoskeletal: Strength & Muscle Tone: within normal limits Gait & Station: slow gait  Patient leans: N/A  Psychiatric Specialty Exam: Physical Exam  ROS denies headache, denies chest pain, no shortness of breath, no vomiting   Blood pressure 110/87, pulse (!) 103, temperature 98 F (36.7 C), temperature source Oral, resp. rate 18, height 5\' 4"  (1.626 m), SpO2 100 %.There is no height or weight on file to calculate BMI.  General Appearance: Fairly Groomed  Eye Contact:  Fair- improves during session  Speech:  Normal Rate  Volume:  Decreased  Mood:  anxious, fearful, denies feeling depressed   Affect:  anxious, guarded, fearful  Thought Process:  Linear and Descriptions of Associations: Tangential-generally well organized, but tends to become  tangential with open ended questions   Orientation:  Other:  she is alert, attentive, and fully oriented x 3   Thought Content:  paranoid/persecutory ideations, denies  hallucinations and does not currently present internally preoccupied   Suicidal Thoughts:  No denies suicidal or self injurious ideations, denies homicidal or violent ideations  Homicidal Thoughts:  No  Memory:  recent and remote grossly intact  Judgement:  Fair  Insight:  Lacking  Psychomotor Activity:  Decreased  Concentration:  Concentration: Fair and Attention Span: Fair  Recall:  [employer]  Fund of Knowledge:  Fair  Language:  Fair  Akathisia:  Negative  Handed:  Right  AIMS (if indicated):     Assets:  Desire for Improvement Resilience  ADL's:  Intact  Cognition:  WNL  Sleep:  Number of Hours: 5.5      COGNITIVE FEATURES THAT CONTRIBUTE TO RISK:  Closed-mindedness and Loss of executive function    SUICIDE RISK:   Moderate:  Frequent suicidal ideation with limited intensity, and duration, some specificity in terms of plans, no associated intent, good self-control, limited dysphoria/symptomatology, some risk factors present, and identifiable protective factors, including available and accessible social support.  PLAN OF CARE: Patient will be admitted to inpatient psychiatric unit for stabilization and safety. Will provide and encourage milieu participation. Provide medication management and maked adjustments as needed.  Will follow daily.    I certify that inpatient services furnished can reasonably be expected to improve the patient's condition.   Craige CottaFernando A Vivianna Piccini, MD 08/29/2017, 12:52 PM
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
## 2017-08-29 NOTE — Progress Notes (Signed)
Pt is observed in the hallway, seen pacing. Pt is seen more engaged more with peers. Pt attended wrap-up group. Pt appears anxious/paranoid/suspicious in affect and mood.Pt denies SI/HI/AVH/Pain at this time.Pt came up to writer frequently asking questions r/t medications.Support provided. PRNs offered; Pt declined. Will continue with POC.

## 2017-08-29 NOTE — [facility] Group Notes (Signed)
  [facility] LCSW Group Therapy Note  Date/Time:  08/29/2017 11:15AM-12:00PM  Type of Therapy and Topic:  Group Therapy:  Feelings About Hospitalization  Participation Level:  Active   Description of Group This process group involved patients discussing their feelings related to being hospitalized, as well as the benefits they see to being in the hospital.  These feelings and benefits were itemized.  The group then brainstormed specific ways in which they could seek those same benefits when they discharge and return home.  Therapeutic Goals 1. Patient will identify and describe positive and negative feelings related to hospitalization 2. Patient will verbalize benefits of hospitalization to themselves personally 3. Patients will brainstorm together ways they can obtain similar benefits in the outpatient setting, identify barriers to wellness and possible solutions  Summary of Patient Progress:  The patient expressed her primary feelings about being hospitalized are "not one word, hopeless, not ready, confused, wish I'd made better or different choices."  She stated that she is here involuntarily and did not realize what was happening.  Her thinking was disorganized and confused, her affect blunted, and her thoughts somewhat blocked; however, she consistently made an effort to communicate.  Therapeutic Modalities Cognitive Behavioral Therapy Motivational Interviewing    Ambrose MantleMareida Grossman-Orr, LCSW 08/29/2017, 2:04 PM

## 2017-08-30 DIAGNOSIS — Z560 Unemployment, unspecified: Secondary | ICD-10-CM

## 2017-08-30 LAB — CBC WITH DIFFERENTIAL/PLATELET
Basophils Absolute: 0 10*3/uL (ref 0.0–0.1)
Basophils Relative: 1 %
Eosinophils Absolute: 0 10*3/uL (ref 0.0–0.7)
Eosinophils Relative: 1 %
HEMATOCRIT: 30.9 % — AB (ref 36.0–46.0)
HEMOGLOBIN: 8.8 g/dL — AB (ref 12.0–15.0)
LYMPHS ABS: 1.3 10*3/uL (ref 0.7–4.0)
LYMPHS PCT: 30 %
MCH: 20 pg — ABNORMAL LOW (ref 26.0–34.0)
MCHC: 28.5 g/dL — AB (ref 30.0–36.0)
MCV: 70.1 fL — AB (ref 78.0–100.0)
MONOS PCT: 7 %
Monocytes Absolute: 0.3 10*3/uL (ref 0.1–1.0)
NEUTROS ABS: 2.7 10*3/uL (ref 1.7–7.7)
Neutrophils Relative %: 61 %
Platelets: 321 10*3/uL (ref 150–400)
RBC: 4.41 MIL/uL (ref 3.87–5.11)
RDW: 18.7 % — AB (ref 11.5–15.5)
WBC: 4.3 10*3/uL (ref 4.0–10.5)

## 2017-08-30 LAB — TSH: TSH: 0.932 u[IU]/mL (ref 0.350–4.500)

## 2017-08-30 LAB — LIPID PANEL
Cholesterol: 181 mg/dL (ref 0–200)
HDL: 65 mg/dL (ref >40)
LDL CALC: 101 mg/dL — AB (ref 0–99)
Total CHOL/HDL Ratio: 2.8 RATIO
Triglycerides: 73 mg/dL (ref <150)
VLDL: 15 mg/dL (ref 0–40)

## 2017-08-30 MED ORDER — SENNOSIDES-DOCUSATE SODIUM 8.6-50 MG PO TABS
1.0000 | ORAL_TABLET | Freq: Two times a day (BID) | ORAL | Status: DC
  Administered 2017-08-30 – 2017-09-04 (×10): 1 via ORAL
  Filled 2017-08-30 (×17): qty 1

## 2017-08-30 MED ORDER — FERROUS SULFATE 325 (65 FE) MG PO TABS
325.0000 mg | ORAL_TABLET | Freq: Three times a day (TID) | ORAL | Status: DC
  Administered 2017-08-30 – 2017-09-04 (×15): 325 mg via ORAL
  Filled 2017-08-30 (×24): qty 1

## 2017-08-30 NOTE — [facility] Group Notes (Signed)
[facility] LCSW Group Therapy Note  Date/Time:  08/30/2017  11:00AM-12:00PM  Type of Therapy and Topic:  Group Therapy:  Music and Mood  Participation Level:  Did Not Attend   Description of Group: In this process group, members listened to a variety of genres of music and identified that different types of music evoke different responses.  Patients were encouraged to identify music that was soothing for them and music that was energizing for them.  Patients discussed how this knowledge can help with wellness and recovery in various ways including managing depression and anxiety as well as encouraging healthy sleep habits.    Therapeutic Goals: 1. Patients will explore the impact of different varieties of music on mood 2. Patients will verbalize the thoughts they have when listening to different types of music 3. Patients will identify music that is soothing to them as well as music that is energizing to them 4. Patients will discuss how to use this knowledge to assist in maintaining wellness and recovery 5. Patients will explore the use of music as a coping skill  Summary of Patient Progress:  N/A  Therapeutic Modalities: Solution Focused Brief Therapy Activity   Winston Sobczyk Grossman-Orr, LCSW    

## 2017-08-30 NOTE — Plan of Care (Signed)
D: Pt denies SI/HI/AV hallucinations. Pt is pleasant and cooperative. Pt goal for today is to relax more when she talks to others.  A: Pt was offered support and encouragement. Pt was given scheduled medications. Pt was encourage to attend groups. Q 15 minute checks were done for safety.  R:Pt attends groups and interacts well with peers and staff. Pt is taking medication. Pt has no complaints.Pt receptive to treatment and safety maintained on unit.    Problem: Safety: Goal: Periods of time without injury will increase Outcome: Progressing

## 2017-08-30 NOTE — Progress Notes (Addendum)
[facility] MD Progress Note  08/30/2017 3:09 PM Kelsey Gray  MRN:  [id number]   Subjective:  Kelsey Gray seen resting in bed after reported assist to wheelchair due to concerns of dizziness. ( No reported falls) however patient denies headaches, nausea vomiting or dizziness during this assessement. States " I  just wanted to sit down while in the cafeteria".  Patient is awake alert and oriented x3.  Denies auditory or visual hallucinations.  Denies suicidal or homicidal ideations.  Reports taking medications with apprehension.  Denies depression or depressive symptoms.  Denies delusions or paranoia.  Was reported patient has multiple somatic concerns throughout the day however is denying with NP.  Reports a good appetite. She is resting well.  Patient placed on unit restriction for safety. NP consulted with Internal medicine doctor will collect H&H and Orthostatic vitals and will start ferrous sulfate supplement 325 mg PO TID. Will continue to encourage hydration and orders  placed orders for CbC.  Patient was agreeable to plan. support encouragement and reassurance was provided  HPI: Per MD assessment note: Kelsey Gray 42 year old female, divorced, has 25 year old daughter, unemployed . Lives in [place] Patient presents as fair historian, states she has been feeling " overworked" and describes feeling overwhelmed due to limited social support and limited resources . States she went to her mother's for help/assistance but that it did not go well . Reports she decided to come to [facility] and presented to ED  Initially reporting physical symptoms ( sore throat, rectal bleeding). Patient presented guarded, paranoid,and as per ED notes, sister and mother provided collateral information reporting that patient has been paranoid, feeling she is being followed, convinced her phone is being tapped, attempting to communicate in code with her sister, not eating with concerns food is poisoned, not sleeping. Currently patient  presents guarded, fearful, states that unit soap is making her feel sick and expressing concerns about the ice in her water Principal Problem   Schizophrenia [facility]) Diagnosis:   Patient Active Problem List   Diagnosis Date Noted  . Microcytic anemia [D50.9] 08/27/2017  . Schizophrenia (HCC) [F20.9] 08/27/2017   Total Time spent with patient: 20 minutes  Past Psychiatric History:  Past Medical History:  Past Medical History:  Diagnosis Date  . Medical history non-contributory    History reviewed. No pertinent surgical history. Family History: History reviewed. No pertinent family history. Family Psychiatric  History:  Social History:  Social History   Substance and Sexual Activity  Alcohol Use Not Currently     Social History   Substance and Sexual Activity  Drug Use Never    Social History   Socioeconomic History  . Marital status: Single    Spouse name: Not on file  . Number of children: Not on file  . Years of education: Not on file  . Highest education level: Not on file  Occupational History  . Not on file  Social Needs  . Financial resource strain: Not on file  . Food insecurity:    Worry: Not on file    Inability: Not on file  . Transportation needs:    Medical: Not on file    Non-medical: Not on file  Tobacco Use  . Smoking status: Never Smoker  . Smokeless tobacco: Never Used  Substance and Sexual Activity  . Alcohol use: Not Currently  . Drug use: Never  . Sexual activity: Not on file  Lifestyle  . Physical activity:    Days per week: Not on  file    Minutes per session: Not on file  . Stress: Not on file  Relationships  . Social connections:    Talks on phone: Not on file    Gets together: Not on file    Attends religious service: Not on file    Active member of club or organization: Not on file    Attends meetings of clubs or organizations: Not on file    Relationship status: Not on file  Other Topics Concern  . Not on file  Social  History Narrative  . Not on file   Additional Social History:                         Sleep: Fair  Appetite:  Fair  Current Medications: Current Facility-Administered Medications  Medication Dose Route Frequency Provider Last Rate Last Dose  . acetaminophen (TYLENOL) tablet 650 mg  650 mg Oral Q6H PRN Truman HaywardStarkes, Takia S, FNP      . alum & mag hydroxide-simeth (MAALOX/MYLANTA) 200-200-20 MG/5ML suspension 30 mL  30 mL Oral Q4H PRN Starkes, Takia S, FNP      . benztropine (COGENTIN) tablet 1 mg  1 mg Oral BID PRN Micheal Likensainville, Christopher T, MD       Or  . benztropine mesylate (COGENTIN) injection 1 mg  1 mg Intramuscular BID PRN Micheal Likensainville, Christopher T, MD      . feeding supplement (ENSURE ENLIVE) (ENSURE ENLIVE) liquid 237 mL  237 mL Oral BID BM Oneta RackLewis, Tanika N, NP   237 mL at 08/30/17 1457  . haloperidol (HALDOL) tablet 5 mg  5 mg Oral Q6H PRN Micheal Likensainville, Christopher T, MD       Or  . haloperidol lactate (HALDOL) injection 5 mg  5 mg Intramuscular Q6H PRN Micheal Likensainville, Christopher T, MD      . hydrOXYzine (ATARAX/VISTARIL) tablet 25 mg  25 mg Oral Q6H PRN Cobos, Rockey SituFernando A, MD      . LORazepam (ATIVAN) tablet 1 mg  1 mg Oral Q6H PRN Cobos, Rockey SituFernando A, MD       Or  . LORazepam (ATIVAN) injection 1 mg  1 mg Intramuscular Q6H PRN Cobos, Fernando A, MD      . magnesium hydroxide (MILK OF MAGNESIA) suspension 30 mL  30 mL Oral Daily PRN Starkes, Takia S, FNP      . mirtazapine (REMERON) tablet 7.5 mg  7.5 mg Oral QHS Oneta RackLewis, Tanika N, NP   7.5 mg at 08/29/17 2119  . risperiDONE (RISPERDAL) tablet 0.5 mg  0.5 mg Oral BH-q7a Cobos, Rockey SituFernando A, MD   0.5 mg at 08/30/17 [id number]  . risperiDONE (RISPERDAL) tablet 1 mg  1 mg Oral QHS Cobos, Rockey SituFernando A, MD   1 mg at 08/29/17 2119    Lab Results:  Results for orders placed or performed during the hospital encounter of 08/28/17 (from the past 48 hour(s))  Pregnancy, urine     Status: None   Collection Time: 08/29/17 11:03 AM  Result Value Ref  Range   Preg Test, Ur NEGATIVE NEGATIVE    Comment:        THE SENSITIVITY OF THIS METHODOLOGY IS >20 mIU/mL. Performed at [facility], [address] [place], [place] [address]   Vitamin B12     Status: Abnormal   Collection Time: 08/29/17  6:50 PM  Result Value Ref Range   Vitamin B-12 1,121 (H) 180 - 914 pg/mL    Comment: (NOTE) This assay  is not validated for testing neonatal or myeloproliferative syndrome specimens for Vitamin B12 levels. Performed at [facility], [address], [place], [place] [address]   Folate     Status: None   Collection Time: 08/29/17  6:50 PM  Result Value Ref Range   Folate 13.9 >5.9 ng/mL    Comment: Performed at [facility], [address] [place], [place] [address]  Iron and TIBC     Status: Abnormal   Collection Time: 08/29/17  6:50 PM  Result Value Ref Range   Iron 15 (L) 28 - 170 ug/dL   TIBC 829 (H) 562 - 130 ug/dL   Saturation Ratios 3 (L) 10.4 - 31.8 %   UIBC 567 ug/dL    Comment: Performed at [facility], [address], [place], [place] [address]  Ferritin     Status: Abnormal   Collection Time: 08/29/17  6:50 PM  Result Value Ref Range   Ferritin 5 (L) 11 - 307 ng/mL    Comment: Performed at [facility], [address] [place], [place] [address]  Reticulocytes     Status: None   Collection Time: 08/29/17  6:50 PM  Result Value Ref Range   Retic Ct Pct 0.8 0.4 - 3.1 %   RBC. 4.58 3.87 - 5.11 MIL/uL   Retic Count, Absolute 36.6 19.0 - 186.0 K/uL    Comment: Performed at [facility], [address] [place], [place] [address]    Blood Alcohol level:  Lab Results  Component Value Date   ETH <10 08/26/2017    Metabolic Disorder Labs: No results found for: HGBA1C, MPG No results found for: PROLACTIN No results found for: CHOL, TRIG, HDL, CHOLHDL, VLDL, LDLCALC  Physical Findings: AIMS:  , ,  ,  ,     CIWA:    COWS:     Musculoskeletal: Strength & Muscle Tone: within normal limits Gait & Station: normal Patient leans: N/A  Psychiatric Specialty Exam: Physical Exam  Vitals reviewed. Constitutional: She is oriented to person, place, and time. She appears well-developed.  Neurological: She is alert and oriented to person, place, and time.  Psychiatric: She has a normal mood and affect. Her behavior is normal.    Review of Systems  Psychiatric/Behavioral: Positive for depression. Negative for suicidal ideas. The patient is nervous/anxious.   All other systems reviewed and are negative.   Blood pressure 93/63, pulse 86, temperature 98.3 F (36.8 C), temperature source Oral, resp. rate 16, height 5\' 4"  (1.626 m), SpO2 100 %.There is no height or weight on file to calculate BMI.  General Appearance: Bizarre and Guarded  Eye Contact:  Fair  Speech:  Clear and Coherent  Volume:  Normal  Mood:  Anxious, Depressed and Dysphoric  Affect:  Congruent  Thought Process:  Coherent  Orientation:  Full (Time, Place, and Person)  Thought Content:  Paranoid Ideation and Rumination  Suicidal Thoughts:  No  Homicidal Thoughts:  No  Memory:  Immediate;   Fair Recent;   Fair Remote;   Fair  Judgement:  Fair  Insight:  Fair  Psychomotor Activity:  Restlessness  Concentration:  Concentration: Fair  Recall:  Fair  Fund of Knowledge:  Poor  Language:  Poor  Akathisia:  No  Handed:  Right  AIMS (if indicated):     Assets:  Communication Skills Desire for Improvement Resilience Social Support  ADL's:  Intact  Cognition:  WNL  Sleep:  Number  of Hours: 6.75     Treatment Plan Summary: Daily contact with patient to assess and evaluate symptoms and progress in treatment and Medication management   Continue with current treatment plan 08/30/2017 as listed below except were noted  Continue Risperdal 0.5mg  po q day and 1mg  Po QHS for mood stabilization Continue Remeron 7.5mg  PO QHS  insomnia   Amenia:  Monitor orthostatic vitals  Continue to encourage hydration  Start ferrous sulfate 325 mg Po TID with colace  Repeat CBC/ H &H- pending results  Will continue to monitor vitals ,medication compliance and treatment side effects while patient is here.  Reviewed labs ,BAL - , UDS -  Pending A1C, TSH, Vit B 12 and prolactin  CSW will continue working on disposition.  Patient to participate in therapeutic milieu  Oneta Rack, NP 08/30/2017, 3:09 PM    ..Agree with NP Progress Note

## 2017-08-30 NOTE — [facility] Suicide Risk Assessment (Signed)
[facility] INPATIENT:  Family/Significant Other Suicide Prevention Education  Suicide Prevention Education:  Contact Attempts: Aida PufferLinda McAddoo - Mother [phone], (name of family member/significant other) has been identified by the patient as the family member/significant other with whom the patient will be residing, and identified as the person(s) who will aid the patient in the event of a mental health crisis.  With written consent from the patient, two attempts were made to provide suicide prevention education, prior to and/or following the patient's discharge.  We were unsuccessful in providing suicide prevention education.  A suicide education pamphlet was given to the patient to share with family/significant other.  Date and time of first attempt: 08/30/2017 at 9:13 am  Mother answered the telephone. Mother stated she did not know anything about Suicide Prevention. Mother then asked if she could come in to talk with CSW. CSW inquired what time she would be able to do so today. Mother stated she was not able to come in person today. CSW explained she is the weekend CSW therefore, she cannot schedule anything for tomorrow. CSW stated typically this is completed via telephone for convenience of the supports. Mother stated she feels a  Phone call is too impersonal and this is much too important information to be discussed via telephone. CSW stated she will leave a message for assigned CSW to reach out to her to discuss this possibility and if it's feasible during the week.      Date and time of second attempt: To be completed at a later day and time.  Shellia CleverlyStephanie N Myrtha Tonkovich 08/30/2017, 9:19 AM

## 2017-08-30 NOTE — Progress Notes (Addendum)
Pt hypotensive this a.m. See vitals.Pt states she felt dizzy. Appears pale. Gatorade and Ensure given. Pt encourage to push fluids and go lay down. Will bring breakfast back for Pt. High fall risk protocol maintained. Will re-check.

## 2017-08-30 NOTE — Progress Notes (Signed)
Pt stated she was not feeling well standing on the line during lunch time in the cafeteria. Two staff started to walk the pt back to the unit. On the hallway pt stated she was getting weak and could not walk and did want to go back to the unit. Pt intentionally tried to make herself  fall, but  the staff helped pt seat on the floor and wheel chair was brought, pt was wheeled back to the unit. BP was taken, 93/63, Pulse 86. Pt was offered something to drink and food offered. NP on duty notified and pt placed on unit restriction.

## 2017-08-30 NOTE — Progress Notes (Signed)
DAR NOTE: Patient presents with anxious affect and depressed mood. Pt has been isolative today, complained of not feeling well most of the time, argumentative about her medications. Denies pain, auditory and visual hallucinations.  Rates depression at 0, hopelessness at 0, and anxiety at 0.  Maintained on routine safety checks.  Medications given as prescribed.  Support and encouragement offered as needed.  Attended group and participated. Will continue to monitor

## 2017-08-30 NOTE — Progress Notes (Signed)
Adult Psychoeducational Group Note  Date:  08/30/2017 Time:  9:42 PM  Group Topic/Focus:  Wrap-Up Group:   The focus of this group is to help patients review their daily goal of treatment and discuss progress on daily workbooks.  Participation Level:  Minimal  Participation Quality:  Appropriate  Affect:  Appropriate  Cognitive:  Appropriate  Insight: Appropriate  Engagement in Group:  Engaged  Modes of Intervention:  Socialization and Support  Additional Comments:  Patient attended and participated in group tonight. She reports having a good day. Her parents visited with her.  She was not feeling well this morning therefore she rested most of the day. She was told to drink more fluids. She is feeling much better.  Kelsey Gray, [facility]sey Gray [facility] 08/30/2017, 9:42 PM

## 2017-08-31 DIAGNOSIS — D6489 Other specified anemias: Secondary | ICD-10-CM

## 2017-08-31 DIAGNOSIS — G4700 Insomnia, unspecified: Secondary | ICD-10-CM

## 2017-08-31 DIAGNOSIS — R451 Restlessness and agitation: Secondary | ICD-10-CM

## 2017-08-31 LAB — BPAM RBC
BLOOD PRODUCT EXPIRATION DATE: 201909212359
Blood Product Expiration Date: 201909212359
UNIT TYPE AND RH: 7300
Unit Type and Rh: 7300

## 2017-08-31 LAB — TYPE AND SCREEN
ABO/RH(D): B POS
Antibody Screen: POSITIVE
UNIT DIVISION: 0
Unit division: 0

## 2017-08-31 LAB — HEMOGLOBIN A1C
Hgb A1c MFr Bld: 5.2 % (ref 4.8–5.6)
Mean Plasma Glucose: 102.54 mg/dL

## 2017-08-31 MED ORDER — MIRTAZAPINE 15 MG PO TABS
15.0000 mg | ORAL_TABLET | Freq: Every day | ORAL | Status: DC
  Administered 2017-08-31 – 2017-09-03 (×4): 15 mg via ORAL
  Filled 2017-08-31 (×6): qty 1

## 2017-08-31 MED ORDER — RISPERIDONE 2 MG PO TABS
2.0000 mg | ORAL_TABLET | Freq: Every day | ORAL | Status: DC
  Administered 2017-08-31 – 2017-09-03 (×4): 2 mg via ORAL
  Filled 2017-08-31 (×6): qty 1

## 2017-08-31 NOTE — Progress Notes (Signed)
Recreation Therapy Notes  Date: 8.26.19 Time: 1000 Location: 500 Hall Dayroom  Group Topic: Coping Skills  Goal Area(s) Addresses:  Patient will be able to identify positive coping skills. Patient will be able to identify benefit of using coping skills post d/c.  Behavioral Response: Engaged  Intervention: Coloring  Activity: PharmacologistCoping Skills.  LRT discussed various coping skills with patients.  Patients then completed that corresponded with their respective coping skills.  Education: PharmacologistCoping Skills, Building control surveyorDischarge Planning.   Education Outcome: Acknowledges understanding/In group clarification offered/Needs additional education.   Clinical Observations/Feedback: Pt was engaged but quiet.  Pt stated she uses journaling and prayer as coping skills.    Caroll RancherMarjette Earlyne Feeser, LRT/CTRS         Caroll RancherLindsay, Torria Fromer A 08/31/2017 12:51 PM

## 2017-08-31 NOTE — [facility] Suicide Risk Assessment (Signed)
[facility] INPATIENT:  Family/Significant Other Suicide Prevention Education  Suicide Prevention Education:  Education Completed; No one has been identified by the patient as the family member/significant other with whom the patient will be residing, and identified as the person(s) who will aid the patient in the event of a mental health crisis (suicidal ideations/suicide attempt).  With written consent from the patient, the family member/significant other has been provided the following suicide prevention education, prior to the and/or following the discharge of the patient.  The suicide prevention education provided includes the following:  Suicide risk factors  Suicide prevention and interventions  National Suicide Hotline telephone number  [facility] assessment telephone number  [facility] Emergency Assistance 911  [facility] and/or Residential Mobile Crisis Unit telephone number  Request made of family/significant other to:  Remove weapons (e.g., guns, rifles, knives), all items previously/currently identified as safety concern.    Remove drugs/medications (over-the-counter, prescriptions, illicit drugs), all items previously/currently identified as a safety concern.  The family member/significant other verbalizes understanding of the suicide prevention education information provided.  The family member/significant other agrees to remove the items of safety concern listed above. The patient did not endorse SI at the time of admission, nor did the patient c/o SI during the stay here.  SPE not required.   Kelsey RogueRodney B Gray Gray 08/31/2017, 5:06 PM

## 2017-08-31 NOTE — Progress Notes (Signed)
Patient with decrease blood pressure 98/73 and 104 pulse when she stands. Patient instructed to go from lying to sitting slowly and to sit prior to standing. Patient given Gatorade and encouraged to drink more fluids today. Patient verbalized understanding. Reported to oncoming nurse.Monitoring of patient continues.

## 2017-08-31 NOTE — [facility] Group Notes (Signed)
LCSW Group Therapy Note   08/31/2017 1:15pm   Type of Therapy and Topic:  Group Therapy:  Overcoming Obstacles   Participation Level:  Active   Description of Group:    In this group patients will be encouraged to explore what they see as obstacles to their own wellness and recovery. They will be guided to discuss their thoughts, feelings, and behaviors related to these obstacles. The group will process together ways to cope with barriers, with attention given to specific choices patients can make. Each patient will be challenged to identify changes they are motivated to make in order to overcome their obstacles. This group will be process-oriented, with patients participating in exploration of their own experiences as well as giving and receiving support and challenge from other group members.   Therapeutic Goals: 1. Patient will identify personal and current obstacles as they relate to admission. 2. Patient will identify barriers that currently interfere with their wellness or overcoming obstacles.  3. Patient will identify feelings, thought process and behaviors related to these barriers. 4. Patient will identify two changes they are willing to make to overcome these obstacles:      Summary of Patient Progress  Stayed the entire time, engaged throughout.  "I need to get better at asking for help from others.  I don't want to be a burden on others, which prevents me from asking."  Admitted that she did ask for help in getting here finally, "but I wish I had asked earlier.  Maybe I would not be here now."     Therapeutic Modalities:   Cognitive Behavioral Therapy Solution Focused Therapy Motivational Interviewing Relapse Prevention Therapy  Ida RogueRodney B Ronzell Laban, LCSW 08/31/2017 4:46 PM

## 2017-08-31 NOTE — Progress Notes (Signed)
[facility]  08/31/2017 1:20 PM Kelsey BarkJade Catano  MRN:  [id number] Subjective:    As per intake SRA: [age] year old female, divorced, has 41 year old daughter, unemployed . Lives in [place] Patient presents as fair historian, states she has been feeling " overworked" and describes feeling overwhelmed due to limited social support and limited resources . States she went to her mother's for help/assistance but that it did not go well . Reports [facility] and [facility]  Initially reporting physical symptoms ( sore throat, rectal bleeding). Patient presented guarded, paranoid,and as per ED notes, sister and mother provided collateral information reporting that patient has been paranoid, feeling she is being followed, convinced her phone is being tapped, attempting to communicate in code with her sister, not eating with concerns food is poisoned, not sleeping. Currently patient presents guarded, fearful, states that unit soap is making her feel sick and expressing concerns about the ice in her water . She denies depression, but states she has been overwhelmed. Reports she has not been eating well because " I don't have a lot of money". Denies suicidal ideations. States her child is now in the custody of the child's father. Denies prior psychiatric admissions , denies history of depression, mania or of psychosis. Denies history of suicide attempts.  Was not taking medications prior to admission.  Today upon evaluation: Pt shares, "I came here on Tuesday - I wanted to get STD testing, but the paperwork didn't make sense and so I ended up leaving the emergency room." Pt reports that she continued to have anxiety at home so her family eventually brought her back to the ED. Pt describes anxiety about seeing "the same cars and people when I go to work" and she describes feeling that she is being followed. She states, "I'm worried they are monitoring my whereabouts and tracking my  location." She notes that some of the distress associated with these feelings has diminished during her stay. She denies physical complaints today. She denies SI/HI/AH/VH. She is tolerating her medications well. She notes her sleep has improved significantly. We discussed increasing her dose of risperdal and remeron this evening, and pt was in agreement.  Principal Problem: Schizophrenia (HCC) Diagnosis:   Patient Active Problem List   Diagnosis Date Noted  . Microcytic anemia [D50.9] 08/27/2017  . Schizophrenia (HCC) [F20.9] 08/27/2017   Total Time spent with patient: 30 minutes  Past Psychiatric History: see H&P  Past Medical History:  Past Medical History:  Diagnosis Date  . Medical history non-contributory    History reviewed. No pertinent surgical history. Family History: History reviewed. No pertinent family history. Family Psychiatric  History: see H&P Social History:  Social History   Substance and Sexual Activity  Alcohol Use Not Currently     Social History   Substance and Sexual Activity  Drug Use Never    Social History   Socioeconomic History  . Marital status: Single    Spouse name: Not on file  . Number of children: Not on file  . Years of education: Not on file  . Highest education level: Not on file  Occupational History  . Not on file  Social Needs  . Financial resource strain: Not on file  . Food insecurity:    Worry: Not on file    Inability: Not on file  . Transportation needs:    Medical: Not on file    Non-medical: Not on file  Tobacco Use  .  Smoking status: Never Smoker  . Smokeless tobacco: Never Used  Substance and Sexual Activity  . Alcohol use: Not Currently  . Drug use: Never  . Sexual activity: Not on file  Lifestyle  . Physical activity:    Days per week: Not on file    Minutes per session: Not on file  . Stress: Not on file  Relationships  . Social connections:    Talks on phone: Not on file    Gets together: Not on file     Attends religious service: Not on file    Active member of club or organization: Not on file    Attends meetings of clubs or organizations: Not on file    Relationship status: Not on file  Other Topics Concern  . Not on file  Social History Narrative  . Not on file   Additional Social History:                         Sleep: Fair  Appetite:  Good  Current Medications: Current Facility-Administered Medications  Medication Dose Route Frequency Provider Last Rate Last Dose  . acetaminophen (TYLENOL) tablet 650 mg  650 mg Oral Q6H PRN Truman Hayward, FNP      . alum & mag hydroxide-simeth (MAALOX/MYLANTA) 200-200-20 MG/5ML suspension 30 mL  30 mL Oral Q4H PRN Starkes, Takia S, FNP      . benztropine (COGENTIN) tablet 1 mg  1 mg Oral BID PRN Micheal Likens, MD       Or  . benztropine mesylate (COGENTIN) injection 1 mg  1 mg Intramuscular BID PRN Micheal Likens, MD      . feeding supplement (ENSURE ENLIVE) (ENSURE ENLIVE) liquid 237 mL  237 mL Oral BID BM Oneta Rack, NP   237 mL at 08/31/17 0956  . ferrous sulfate tablet 325 mg  325 mg Oral TID WC Oneta Rack, NP   325 mg at 08/31/17 1300  . haloperidol (HALDOL) tablet 5 mg  5 mg Oral Q6H PRN Micheal Likens, MD       Or  . haloperidol lactate (HALDOL) injection 5 mg  5 mg Intramuscular Q6H PRN Micheal Likens, MD      . hydrOXYzine (ATARAX/VISTARIL) tablet 25 mg  25 mg Oral Q6H PRN Cobos, Rockey Situ, MD      . LORazepam (ATIVAN) tablet 1 mg  1 mg Oral Q6H PRN Cobos, Rockey Situ, MD       Or  . LORazepam (ATIVAN) injection 1 mg  1 mg Intramuscular Q6H PRN Cobos, Fernando A, MD      . magnesium hydroxide (MILK OF MAGNESIA) suspension 30 mL  30 mL Oral Daily PRN Starkes, Takia S, FNP      . mirtazapine (REMERON) tablet 15 mg  15 mg Oral QHS Jolyne Loa T, MD      . risperiDONE (RISPERDAL) tablet 2 mg  2 mg Oral QHS Micheal Likens, MD      . senna-docusate  (Senokot-S) tablet 1 tablet  1 tablet Oral BID Oneta Rack, NP   1 tablet at 08/31/17 1610    Lab Results:  Results for orders placed or performed during the hospital encounter of 08/28/17 (from the past 48 hour(s))  Vitamin B12     Status: Abnormal   Collection Time: 08/29/17  6:50 PM  Result Value Ref Range   Vitamin B-12 1,121 (H) 180 - 914 pg/mL  Comment: (NOTE) This assay is not validated for testing neonatal or myeloproliferative syndrome specimens for Vitamin B12 levels. Performed at [facility], [address] [place], [place] [address]   Folate     Status: None   Collection Time: 08/29/17  6:50 PM  Result Value Ref Range   Folate 13.9 >5.9 ng/mL    Comment: Performed at [facility], [address], [place], [place] [address]  Iron and TIBC     Status: Abnormal   Collection Time: 08/29/17  6:50 PM  Result Value Ref Range   Iron 15 (L) 28 - 170 ug/dL   TIBC 098 (H) 119 - 147 ug/dL   Saturation Ratios 3 (L) 10.4 - 31.8 %   UIBC 567 ug/dL    Comment: Performed at [facility], [address] [place], [place] [address]  Ferritin     Status: Abnormal   Collection Time: 08/29/17  6:50 PM  Result Value Ref Range   Ferritin 5 (L) 11 - 307 ng/mL    Comment: Performed at [facility], [address], [place], [place] [address]  Reticulocytes     Status: None   Collection Time: 08/29/17  6:50 PM  Result Value Ref Range   Retic Ct Pct 0.8 0.4 - 3.1 %   RBC. 4.58 3.87 - 5.11 MIL/uL   Retic Count, Absolute 36.6 19.0 - 186.0 K/uL    Comment: Performed at [facility], [address] [place], [place] [address]  TSH     Status: None   Collection Time: 08/30/17  6:24 PM  Result Value Ref Range   TSH 0.932 0.350 - 4.500 uIU/mL    Comment: Performed by a 3rd Generation assay with a functional sensitivity of <=0.01 uIU/mL. Performed at [facility], [address], [place], [place] [address]   Hemoglobin A1c     Status: None   Collection Time: 08/30/17  6:24 PM  Result Value Ref Range   Hgb A1c MFr Bld 5.2 4.8 - 5.6 %    Comment: (NOTE) Pre diabetes:          5.7%-6.4% Diabetes:              >6.4% Glycemic control for   <7.0% adults with diabetes    Mean Plasma Glucose 102.54 mg/dL    Comment: Performed at [facility], [address], [place], [place] [address]  Lipid panel     Status: Abnormal   Collection Time: 08/30/17  6:24 PM  Result Value Ref Range   Cholesterol 181 0 - 200 mg/dL   Triglycerides 73 <324 mg/dL   HDL 65 >40 mg/dL   Total CHOL/HDL Ratio 2.8 RATIO   VLDL 15 0 - 40 mg/dL   LDL Cholesterol 102 (H) 0 - 99 mg/dL    Comment:        Total Cholesterol/HDL:CHD Risk Coronary Heart Disease Risk Table                     Men   Women  1/2 Average Risk   3.4   3.3  Average Risk       5.0   4.4  2 X Average Risk   9.6   7.1  3 X Average Risk  23.4   11.0        Use the calculated Patient Ratio above and the CHD Risk Table to determine the patient's CHD Risk.  ATP III CLASSIFICATION (LDL):  <100     mg/dL   Optimal  161-096  mg/dL   Near or Above                    Optimal  130-159  mg/dL   Borderline  045-409  mg/dL   High  >811     mg/dL   Very High Performed at [facility], [address] [place], [place] [address]   CBC with Differential/Platelet     Status: Abnormal   Collection Time: 08/30/17  6:24 PM  Result Value Ref Range   WBC 4.3 4.0 - 10.5 K/uL   RBC 4.41 3.87 - 5.11 MIL/uL   Hemoglobin 8.8 (L) 12.0 - 15.0 g/dL   HCT 29.5 (L) 62.1 - 30.8 %   MCV 70.1 (L) 78.0 - 100.0 fL   MCH 20.0 (L) 26.0 - 34.0 pg   MCHC 28.5 (L) 30.0 - 36.0 g/dL   RDW 65.7 (H) 84.6 - 96.2 %   Platelets 321 150 - 400 K/uL   Neutrophils Relative % 61 %   Lymphocytes Relative 30 %   Monocytes Relative 7 %   Eosinophils Relative 1 %   Basophils Relative 1 %   Neutro Abs 2.7 1.7 - 7.7 K/uL    Lymphs Abs 1.3 0.7 - 4.0 K/uL   Monocytes Absolute 0.3 0.1 - 1.0 K/uL   Eosinophils Absolute 0.0 0.0 - 0.7 K/uL   Basophils Absolute 0.0 0.0 - 0.1 K/uL   RBC Morphology TARGET CELLS     Comment: ELLIPTOCYTES Performed at [facility], [address] [place], [place] [address]     Blood Alcohol level:  Lab Results  Component Value Date   ETH <10 08/26/2017    Metabolic Disorder Labs: Lab Results  Component Value Date   HGBA1C 5.2 08/30/2017   MPG 102.54 08/30/2017   No results found for: PROLACTIN Lab Results  Component Value Date   CHOL 181 08/30/2017   TRIG 73 08/30/2017   HDL 65 08/30/2017   CHOLHDL 2.8 08/30/2017   VLDL 15 08/30/2017   LDLCALC 101 (H) 08/30/2017    Physical Findings: AIMS:  , ,  ,  ,    CIWA:    COWS:     Musculoskeletal: Strength & Muscle Tone: within normal limits Gait & Station: normal Patient leans: N/A  Psychiatric Specialty Exam: Physical Exam  Nursing note and vitals reviewed.   Review of Systems  Constitutional: Negative for chills and fever.  Respiratory: Negative for cough and shortness of breath.   Cardiovascular: Negative for chest pain.  Gastrointestinal: Negative for abdominal pain, heartburn, nausea and vomiting.  Psychiatric/Behavioral: Negative for depression, hallucinations and suicidal ideas. The patient is nervous/anxious and has insomnia.     Blood pressure 98/73, pulse (!) 104, temperature 98.4 F (36.9 C), temperature source Oral, resp. rate 16, height 5\' 4"  (1.626 m), SpO2 100 %.There is no height or weight on file to calculate BMI.  General Appearance: Casual and Fairly Groomed  Eye Contact:  Good  Speech:  Clear and Coherent  Volume:  Decreased  Mood:  Anxious and Depressed  Affect:  Appropriate, Congruent, Constricted and Flat  Thought Process:  Coherent, Goal Directed and Descriptions of Associations: Loose  Orientation:  Full (Time, Place, and Person)  Thought Content:  Delusions,  Ideas of Reference:   Paranoia Delusions and Paranoid Ideation  Suicidal Thoughts:  No  Homicidal Thoughts:  No  Memory:  Immediate;   Fair Recent;   Fair Remote;   Fair  Judgement:  Poor  Insight:  Lacking  Psychomotor Activity:  Normal  Concentration:  Concentration: Fair  Recall:  [employer] of Knowledge:  Fair  Language:  Fair  Akathisia:  No  Handed:    AIMS (if indicated):     Assets:  Resilience Social Support  ADL's:  Intact  Cognition:  WNL  Sleep:  Number of Hours: 5.75   Treatment Plan Summary: Daily contact with patient to assess and evaluate symptoms and progress in treatment and Medication management   -Continue inpatient hospitalization  -Schizophrenia   -Change risperdal 0.5mg  qAM +1mg  po qhs to risperdal 2mg  po qhs   -Change remeron 7.5mg  po qhs to remeron 15mg  po qhs  -EPS   -Continue cogentin 1mg  po/IM q12h prn EPS  -microcytic anemia  -Continue terrous sulfate 325mg  po TID with meals  -agitation    -Continue haldol 5mg  po/IM q6h prn agitation   -Continue vistaril 1mg  po/IM q6h prn agitation  -anxiety  -Continue vistaril 25mg  po q6h prn anxiety  -Encourage participation in groups and therapeutic milieu  -disposition planning will be ongoing  Micheal Likens, MD 08/31/2017, 1:20 PM

## 2017-08-31 NOTE — Progress Notes (Signed)
Patient denies SI, HI and AVH.  Patient has been compliant with medications this shift.  Patient has attended groups, engaged in unit activities, and had no incidents of behavioral dyscontrol.   Assess patient for safety, offer medications as prescribed, engage patient in 1:1 staff talks.   Patient able to contract for safety.  Continue to monitor as planned.  

## 2017-08-31 NOTE — Progress Notes (Signed)
D:  Kelsey Gray was pleasant and cooperative on approach.  She was guarded but denied SI/HI or A/V hallucinations.  He attended evening wrap up group.  Minimal interaction with staff or peers.  She was hesitant to take hs medications and had many questions that were answered by RN.  She took them without difficulty.  She denied any pain or discomfort and appeared to be in no physical distress.   A:  1:1 with RN for support and encouragement.  Medications as ordered.  Q 15 minute checks maintained for safety.  Encouraged participation in group and unit activities.   R:  Kelsey Gray remains safe on the unit.  We will continue to monitor the progress towards her goals.

## 2017-08-31 NOTE — Tx Team (Signed)
Interdisciplinary Treatment and Diagnostic Plan Update  08/31/2017 Time of Session: 8:53 AM  Kelsey Gray MRN: [id number]  Principal Diagnosis: Schizophrenia [facility])  Secondary Diagnoses: Principal Problem:   Schizophrenia ([place])   Current Medications:  Current Facility-Administered Medications  Medication Dose Route Frequency Provider Last Rate Last Dose  . acetaminophen (TYLENOL) tablet 650 mg  650 mg Oral Q6H PRN Nanci Pina, FNP      . alum & mag hydroxide-simeth (MAALOX/MYLANTA) 200-200-20 MG/5ML suspension 30 mL  30 mL Oral Q4H PRN Starkes, Takia S, FNP      . benztropine (COGENTIN) tablet 1 mg  1 mg Oral BID PRN Pennelope Bracken, MD       Or  . benztropine mesylate (COGENTIN) injection 1 mg  1 mg Intramuscular BID PRN Pennelope Bracken, MD      . feeding supplement (ENSURE ENLIVE) (ENSURE ENLIVE) liquid 237 mL  237 mL Oral BID BM Derrill Center, NP   237 mL at 08/30/17 1457  . ferrous sulfate tablet 325 mg  325 mg Oral TID WC Derrill Center, NP   325 mg at 08/31/17 6659  . haloperidol (HALDOL) tablet 5 mg  5 mg Oral Q6H PRN Pennelope Bracken, MD       Or  . haloperidol lactate (HALDOL) injection 5 mg  5 mg Intramuscular Q6H PRN Pennelope Bracken, MD      . hydrOXYzine (ATARAX/VISTARIL) tablet 25 mg  25 mg Oral Q6H PRN Cobos, Myer Peer, MD      . LORazepam (ATIVAN) tablet 1 mg  1 mg Oral Q6H PRN Cobos, Myer Peer, MD       Or  . LORazepam (ATIVAN) injection 1 mg  1 mg Intramuscular Q6H PRN Cobos, Fernando A, MD      . magnesium hydroxide (MILK OF MAGNESIA) suspension 30 mL  30 mL Oral Daily PRN Starkes, Takia S, FNP      . mirtazapine (REMERON) tablet 7.5 mg  7.5 mg Oral QHS Derrill Center, NP   7.5 mg at 08/30/17 2122  . risperiDONE (RISPERDAL) tablet 0.5 mg  0.5 mg Oral BH-q7a Cobos, Myer Peer, MD   0.5 mg at 08/31/17 9357  . risperiDONE (RISPERDAL) tablet 1 mg  1 mg Oral QHS Cobos, Myer Peer, MD   1 mg at 08/30/17 2122  . senna-docusate  (Senokot-S) tablet 1 tablet  1 tablet Oral BID Derrill Center, NP   1 tablet at 08/31/17 0177    PTA Medications: No medications prior to admission.    Patient Stressors: Marital or family conflict Occupational concerns  Patient Strengths: Average or above average intelligence Communication skills General fund of knowledge Supportive family/friends  Treatment Modalities: Medication Management, Group therapy, Case management,  1 to 1 session with clinician, Psychoeducation, Recreational therapy.   Physician Treatment Plan for Primary Diagnosis: Schizophrenia ([place]) Long Term Goal(s): Improvement in symptoms so as ready for discharge  Short Term Goals: Ability to identify changes in lifestyle to reduce recurrence of condition will improve Ability to verbalize feelings will improve Ability to maintain clinical measurements within normal limits will improve Compliance with prescribed medications will improve Ability to verbalize feelings will improve Ability to demonstrate self-control will improve Ability to identify and develop effective coping behaviors will improve Compliance with prescribed medications will improve  Medication Management: Evaluate patient's response, side effects, and tolerance of medication regimen.  Therapeutic Interventions: 1 to 1 sessions, Unit Group sessions and Medication administration.  Evaluation of Outcomes: Progressing  Physician Treatment Plan for Secondary Diagnosis: Principal Problem:   Schizophrenia ([place])   Long Term Goal(s): Improvement in symptoms so as ready for discharge  Short Term Goals: Ability to identify changes in lifestyle to reduce recurrence of condition will improve Ability to verbalize feelings will improve Ability to maintain clinical measurements within normal limits will improve Compliance with prescribed medications will improve Ability to verbalize feelings will improve Ability to demonstrate self-control will  improve Ability to identify and develop effective coping behaviors will improve Compliance with prescribed medications will improve  Medication Management: Evaluate patient's response, side effects, and tolerance of medication regimen.  Therapeutic Interventions: 1 to 1 sessions, Unit Group sessions and Medication administration.  Evaluation of Outcomes: Progressing   RN Treatment Plan for Primary Diagnosis: Schizophrenia ([place]) Long Term Goal(s): Knowledge of disease and therapeutic regimen to maintain health will improve  Short Term Goals: Ability to identify and develop effective coping behaviors will improve and Compliance with prescribed medications will improve  Medication Management: RN will administer medications as ordered by provider, will assess and evaluate patient's response and provide education to patient for prescribed medication. RN will report any adverse and/or side effects to prescribing provider.  Therapeutic Interventions: 1 on 1 counseling sessions, Psychoeducation, Medication administration, Evaluate responses to treatment, Monitor vital signs and CBGs as ordered, Perform/monitor CIWA, COWS, AIMS and Fall Risk screenings as ordered, Perform wound care treatments as ordered.  Evaluation of Outcomes: Progressing   LCSW Treatment Plan for Primary Diagnosis: Schizophrenia ([place]) Long Term Goal(s): Safe transition to appropriate next level of care at discharge, Engage patient in therapeutic group addressing interpersonal concerns.  Short Term Goals: Engage patient in aftercare planning with referrals and resources  Therapeutic Interventions: Assess for all discharge needs, 1 to 1 time with Social worker, Explore available resources and support systems, Assess for adequacy in community support network, Educate family and significant other(s) on suicide prevention, Complete Psychosocial Assessment, Interpersonal group therapy.  Evaluation of Outcomes: Met Return home,  follow up outpt   Progress in Treatment: Attending groups: Yes Participating in groups: Yes Taking medication as prescribed: Yes Toleration medication: Yes, no side effects reported at this time Family/Significant other contact made: No Patient understands diagnosis: Yes AEB asking for help with mental health sympotms Discussing patient identified problems/goals with staff: Yes Medical problems stabilized or resolved: Yes Denies suicidal/homicidal ideation: Yes Issues/concerns per patient self-inventory: None Other: N/A  New problem(s) identified: None identified at this time.   New Short Term/Long Term Goal(s): "I'll give some examples to help you understand. I want to be less nervous when I am talking with people. I want more regular sleep and a regular eating schedule."  Discharge Plan or Barriers:   Reason for Continuation of Hospitalization: Paranoia Disorganization Medication stabilization   Estimated Length of Stay: 8/30  Attendees: Patient: Kelsey Gray 08/31/2017  8:53 AM  Physician: Maris Berger, MD 08/31/2017  8:53 AM  Nursing: Sena Hitch, RN 08/31/2017  8:53 AM  RN Care Manager: Lars Pinks, RN 08/31/2017  8:53 AM  Social Worker: Ripley Fraise 08/31/2017  8:53 AM  Recreational Therapist: Winfield Cunas 08/31/2017  8:53 AM  Other: Norberto Sorenson 08/31/2017  8:53 AM  Other:  08/31/2017  8:53 AM    Scribe for Treatment Team:  Roque Lias LCSW 08/31/2017 8:53 AM

## 2017-08-31 NOTE — Progress Notes (Signed)
Recreation Therapy Notes  INPATIENT RECREATION THERAPY ASSESSMENT  Patient Details Name: Kelsey BarkJade Gray MRN: [id number] DOB: [birth year] Today's Date: 08/31/2017       Information Obtained From: Patient  Able to Participate in Assessment/Interview: Yes  Patient Presentation: Responsive, Withdrawn(Patient was tearful throughout assessment with LRT.)  Reason for Admission (Per Patient): Other (Comments)(Patient stated she went to the ED wanting "a referral for outpatieent therapy or counseling, and also for STD testing. I wanted a life counselor".)  Patient Stressors: Work, Other (Comment)(Patient stated her stressors were revolved around work, transportation to work, paying bills and maintaining the house.)  Coping Skills:   Film/video editorsolation, Doctor, hospitalrayer, Music, Other (Comment)(Patient stated she likes to "spend time with my daughter, and work when I have the ability to do so, to check things off of my to do list". )  Leisure Interests (2+):  Social - Family, Individual - Other (Comment)(Patient stated she enjoys spending time with her daughter, reading, and cooking. )  Frequency of Recreation/Participation: Weekly  Awareness of Community Resources:  No(Patient stated that she does not use community resources anymore due to lack of money and funding. )  Programmer, applicationsCommunity Resources:     Current Use:    If no, Barriers?:    Expressed Interest in State [employer] CorporationCommunity Resource Information: No  [place] and step father[place]  Patient Main Form of Transportation: Set designerCar  Patient Strengths:  "being a loyal mother and being a loyal friend"  Patient Identified Areas of Improvement:  "time managment and anxiety"  Patient Goal for Hospitalization:  "To get well enough to leave and live with my mom and see my daughter"  Current SI (including self-harm):  No  Current HI:  No  Current AVH: No  Staff  Intervention Plan: Group Attendance, Collaborate with Interdisciplinary Treatment Team  Consent to Intern Participation: N/A   Pat PatrickMariah Boots [place], LRT/CTRS  Keyshla Tunison L Wallis Vancott 08/31/2017, 11:52 AM

## 2017-09-01 LAB — PROLACTIN: Prolactin: 90 ng/mL — ABNORMAL HIGH (ref 4.8–23.3)

## 2017-09-01 NOTE — Plan of Care (Signed)
  Problem: Activity: Goal: Interest or engagement in activities will improve Outcome: Progressing-Jamice has been attending groups.

## 2017-09-01 NOTE — Progress Notes (Signed)
Recreation Therapy Notes  Date: 8.27.19 Time: 1000 Location: 500 Hall Dayroom  Group Topic: Self-Esteem  Goal Area(s) Addresses:  Patient will successfully identify positive attributes about themselves.  Patient will successfully identify benefit of improved self-esteem.   Behavioral Response: Engaged  Intervention: Worksheets, markers  Activity: Self-esteem/Positive Affirmations.  LRT introduced self-esteem to patients.  Patients were given a piece of construction paper.  Patients wrote their names on the paper and passed it to the left.  Each person got a chance to write something positive about their peers.  Patients then completed an "I Like Myself A-Z" worksheet.  Education:  Self-Esteem, Building control surveyorDischarge Planning.   Education Outcome: Acknowledges education/In group clarification offered/Needs additional education  Clinical Observations/Feedback: Pt expressed self esteem was "the way a person feels about themselves".    Caroll RancherMarjette Etoy Mcdonnell, LRT/CTRS      Lillia AbedLindsay, Merridy Pascoe A 09/01/2017 11:15 AM

## 2017-09-01 NOTE — [facility] Group Notes (Signed)
LCSW Group Therapy Note   09/01/2017 1:15pm   Type of Therapy and Topic:  Group Therapy:  Positive Affirmations   Participation Level:  Active  Description of Group: This group addressed positive affirmation toward self and others. Patients went around the room and identified two positive things about themselves and two positive things about a peer in the room. Patients reflected on how it felt to share something positive with others, to identify positive things about themselves, and to hear positive things from others. Patients were encouraged to have a daily reflection of positive characteristics or circumstances.  Therapeutic Goals 1. Patient will verbalize two of their positive qualities 2. Patient will demonstrate empathy for others by stating two positive qualities about a peer in the group 3. Patient will verbalize their feelings when voicing positive self affirmations and when voicing positive affirmations of others 4. Patients will discuss the potential positive impact on their wellness/recovery of focusing on positive traits of self and others. Summary of Patient Progress:  Stayed the entire time, engaged throughout.  "I believe the break-up of my marriage was the biggest challenge I have ever been through.  To stay strong, I focused on my responsibilities, my daughter and my job.  The reason I was able to stay strong was because of my child and my family, and also, failure is not an option."  Therapeutic Modalities Cognitive Behavioral Therapy Motivational Interviewing  Ida RogueRodney B Drue Gray, [place]LCS[place] 09/01/2017 2:46 PM

## 2017-09-01 NOTE — Progress Notes (Signed)
   D: When asked about her day pt stated, "it was alright". Pt appeared to be quiet and somewhat apprehensive when speaking with the Clinical research associatewriter. Pt was also cautious about her meds, asking the writer if they were the meds she had taken. Writer showed pkg and pt was satisfied stating, "the 2 mg must look different than the 1mg ", referring to the risperdal. Stated, she was informed by her dr that she may be discharged Thurs, or Fri. Pt plans to live with her parents "until she gets herself together". Writer attempted to assure that we all need help some time, and pt stated, "but I should be the one taking care of them". Pt has no other questions or concerns.    A:  Support and encouragement was offered. Pt was adm meds as ordered. 15 min checks continued for safety.  R: Pt remains safe.

## 2017-09-01 NOTE — ED Provider Notes (Signed)
  [facility]   [id number] 08/26/17 Arrival Time: 1204  ASSESSMENT & PLAN:  Patient LWBS.   Mardella LaymanHagler, Tamarra Geiselman, MD 09/01/17 [phone]

## 2017-09-01 NOTE — Progress Notes (Signed)
[facility] Progress Note  09/01/2017 11:51 AM Kelsey BarkJade Gray  MRN:  [id number] Subjective:    As per intake SRA: 42 year old female, divorced, has 354 year old daughter, unemployed . Lives in [place] Patient presents as fair historian, states she has been feeling " overworked" and describes feeling overwhelmed due to limited social support and limited resources . States she went to her mother's for help/assistance but that it did not go well . [place] [facility] and [place] Initially reporting physical symptoms ( sore throat, rectal bleeding). Patient presented guarded, paranoid,and as per ED notes, sister and mother provided collateral information reporting that patient has been paranoid, feeling she is being followed, convinced her phone is being tapped, attempting to communicate in code with her sister, not eating with concerns food is poisoned, not sleeping. Currently patient presents guarded, fearful, states that unit soap is making her feel sick and expressing concerns about the ice in her water . She denies depression, but states she has been overwhelmed. Reports she has not been eating well because " I don't have a lot of money". Denies suicidal ideations. States her child is now in the custody of the child's father. Denies prior psychiatric admissions , denies history of depression, mania or of psychosis. Denies history of suicide attempts.  Was not taking medications prior to admission.  Today upon evaluation: Pt shares, "I'm feeling better today. I'm doing good." Pt reports she is sleeping well. Her appetite is good. She denies physical complaints aside from some drowsiness this AM, which she associates with use of PRN medications for insomnia. Discussed with patient that she should attempt to sleep without PRN medications at first as her scheduled medications may be enough to help her get to sleep, and pt acknowledged good understanding. She denies anxiety and  paranoia today. She denies SI/HI/AH/VH. She is tolerating her medications well overall and she feels that they have been helpful. She is in agreement to continue her current regimen without changes. She had no further questions, comments, or concerns.  Principal Problem: Schizophrenia (HCC) Diagnosis:   Patient Active Problem List   Diagnosis Date Noted  . Microcytic anemia [D50.9] 08/27/2017  . Schizophrenia (HCC) [F20.9] 08/27/2017   Total Time spent with patient: 30 minutes  Past Psychiatric History: See H&P  Past Medical History:  Past Medical History:  Diagnosis Date  . Medical history non-contributory    History reviewed. No pertinent surgical history. Family History: History reviewed. No pertinent family history. Family Psychiatric  History: see H&P Social History:  Social History   Substance and Sexual Activity  Alcohol Use Not Currently     Social History   Substance and Sexual Activity  Drug Use Never    Social History   Socioeconomic History  . Marital status: Single    Spouse name: Not on file  . Number of children: Not on file  . Years of education: Not on file  . Highest education level: Not on file  Occupational History  . Not on file  Social Needs  . Financial resource strain: Not on file  . Food insecurity:    Worry: Not on file    Inability: Not on file  . Transportation needs:    Medical: Not on file    Non-medical: Not on file  Tobacco Use  . Smoking status: Never Smoker  . Smokeless tobacco: Never Used  Substance and Sexual Activity  . Alcohol use: Not Currently  . Drug  use: Never  . Sexual activity: Not on file  Lifestyle  . Physical activity:    Days per week: Not on file    Minutes per session: Not on file  . Stress: Not on file  Relationships  . Social connections:    Talks on phone: Not on file    Gets together: Not on file    Attends religious service: Not on file    Active member of club or organization: Not on file     Attends meetings of clubs or organizations: Not on file    Relationship status: Not on file  Other Topics Concern  . Not on file  Social History Narrative  . Not on file   Additional Social History:                         Sleep: Good  Appetite:  Good  Current Medications: Current Facility-Administered Medications  Medication Dose Route Frequency Provider Last Rate Last Dose  . acetaminophen (TYLENOL) tablet 650 mg  650 mg Oral Q6H PRN Truman Hayward, FNP      . alum & mag hydroxide-simeth (MAALOX/MYLANTA) 200-200-20 MG/5ML suspension 30 mL  30 mL Oral Q4H PRN Starkes, Takia S, FNP      . benztropine (COGENTIN) tablet 1 mg  1 mg Oral BID PRN Micheal Likens, MD       Or  . benztropine mesylate (COGENTIN) injection 1 mg  1 mg Intramuscular BID PRN Micheal Likens, MD      . feeding supplement (ENSURE ENLIVE) (ENSURE ENLIVE) liquid 237 mL  237 mL Oral BID BM Oneta Rack, NP   237 mL at 08/31/17 1454  . ferrous sulfate tablet 325 mg  325 mg Oral TID WC Oneta Rack, NP   325 mg at 09/01/17 8413  . haloperidol (HALDOL) tablet 5 mg  5 mg Oral Q6H PRN Micheal Likens, MD       Or  . haloperidol lactate (HALDOL) injection 5 mg  5 mg Intramuscular Q6H PRN Micheal Likens, MD      . hydrOXYzine (ATARAX/VISTARIL) tablet 25 mg  25 mg Oral Q6H PRN Cobos, Rockey Situ, MD      . LORazepam (ATIVAN) tablet 1 mg  1 mg Oral Q6H PRN Cobos, Rockey Situ, MD       Or  . LORazepam (ATIVAN) injection 1 mg  1 mg Intramuscular Q6H PRN Cobos, Fernando A, MD      . magnesium hydroxide (MILK OF MAGNESIA) suspension 30 mL  30 mL Oral Daily PRN Starkes, Takia S, FNP      . mirtazapine (REMERON) tablet 15 mg  15 mg Oral QHS Micheal Likens, MD   15 mg at 08/31/17 2158  . risperiDONE (RISPERDAL) tablet 2 mg  2 mg Oral QHS Micheal Likens, MD   2 mg at 08/31/17 2158  . senna-docusate (Senokot-S) tablet 1 tablet  1 tablet Oral BID Oneta Rack,  NP   1 tablet at 09/01/17 2440    Lab Results:  Results for orders placed or performed during the hospital encounter of 08/28/17 (from the past 48 hour(s))  TSH     Status: None   Collection Time: 08/30/17  6:24 PM  Result Value Ref Range   TSH 0.932 0.350 - 4.500 uIU/mL    Comment: Performed by a 3rd Generation assay with a functional sensitivity of <=0.01 uIU/mL. Performed at [facility], [address] [place], [place] [address]   Prolactin     Status: Abnormal   Collection Time: 08/30/17  6:24 PM  Result Value Ref Range   Prolactin 90.0 (H) 4.8 - 23.3 ng/mL    Comment: (NOTE) Performed At: [facility] [address] [place], [place] [id number] Jolene Schimke MD [id number]   Hemoglobin A1c     Status: None   Collection Time: 08/30/17  6:24 PM  Result Value Ref Range   Hgb A1c MFr Bld 5.2 4.8 - 5.6 %    Comment: (NOTE) Pre diabetes:          5.7%-6.4% Diabetes:              >6.4% Glycemic control for   <7.0% adults with diabetes    Mean Plasma Glucose 102.54 mg/dL    Comment: Performed at [facility], [address] [place], [place] [address]  Lipid panel     Status: Abnormal   Collection Time: 08/30/17  6:24 PM  Result Value Ref Range   Cholesterol 181 0 - 200 mg/dL   Triglycerides 73 <784 mg/dL   HDL 65 >69 mg/dL   Total CHOL/HDL Ratio 2.8 RATIO   VLDL 15 0 - 40 mg/dL   LDL Cholesterol 629 (H) 0 - 99 mg/dL    Comment:        Total Cholesterol/HDL:CHD Risk Coronary Heart Disease Risk Table                     Men   Women  1/2 Average Risk   3.4   3.3  Average Risk       5.0   4.4  2 X Average Risk   9.6   7.1  3 X Average Risk  23.4   11.0        Use the calculated Patient Ratio above and the CHD Risk Table to determine the patient's CHD Risk.        ATP III CLASSIFICATION (LDL):  <100     mg/dL   Optimal  528-413  mg/dL   Near or Above                    Optimal  130-159  mg/dL   Borderline  244-010  mg/dL    High  >272     mg/dL   Very High Performed at [facility], [address] [place], [place] [address]   CBC with Differential/Platelet     Status: Abnormal   Collection Time: 08/30/17  6:24 PM  Result Value Ref Range   WBC 4.3 4.0 - 10.5 K/uL   RBC 4.41 3.87 - 5.11 MIL/uL   Hemoglobin 8.8 (L) 12.0 - 15.0 g/dL   HCT 40.3 (L) 47.4 - 25.9 %   MCV 70.1 (L) 78.0 - 100.0 fL   MCH 20.0 (L) 26.0 - 34.0 pg   MCHC 28.5 (L) 30.0 - 36.0 g/dL   RDW 56.3 (H) 87.5 - 64.3 %   Platelets 321 150 - 400 K/uL   Neutrophils Relative % 61 %   Lymphocytes Relative 30 %   Monocytes Relative 7 %   Eosinophils Relative 1 %   Basophils Relative 1 %   Neutro Abs 2.7 1.7 - 7.7 K/uL   Lymphs Abs 1.3 0.7 - 4.0 K/uL   Monocytes Absolute 0.3 0.1 - 1.0 K/uL   Eosinophils Absolute 0.0 0.0 - 0.7 K/uL   Basophils Absolute 0.0 0.0 -  0.1 K/uL   RBC Morphology TARGET CELLS     Comment: ELLIPTOCYTES Performed at [facility], [address], [place], [place] [address]     Blood Alcohol level:  Lab Results  Component Value Date   ETH <10 08/26/2017    Metabolic Disorder Labs: Lab Results  Component Value Date   HGBA1C 5.2 08/30/2017   MPG 102.54 08/30/2017   Lab Results  Component Value Date   PROLACTIN 90.0 (H) 08/30/2017   Lab Results  Component Value Date   CHOL 181 08/30/2017   TRIG 73 08/30/2017   HDL 65 08/30/2017   CHOLHDL 2.8 08/30/2017   VLDL 15 08/30/2017   LDLCALC 101 (H) 08/30/2017    Physical Findings: AIMS:  , ,  ,  ,    CIWA:    COWS:     Musculoskeletal: Strength & Muscle Tone: within normal limits Gait & Station: normal Patient leans: N/A  Psychiatric Specialty Exam: Physical Exam  Nursing note and vitals reviewed.   Review of Systems  Constitutional: Positive for malaise/fatigue. Negative for chills and fever.  Respiratory: Negative for cough and shortness of breath.   Cardiovascular: Negative for chest pain.  Gastrointestinal:  Negative for abdominal pain, heartburn, nausea and vomiting.  Psychiatric/Behavioral: Negative for depression, hallucinations and suicidal ideas. The patient is not nervous/anxious and does not have insomnia.     Blood pressure (!) 97/57, pulse (!) 104, temperature 98 F (36.7 C), temperature source Oral, resp. rate 16, height 5\' 4"  (1.626 m), SpO2 100 %.There is no height or weight on file to calculate BMI.  General Appearance: Casual and Fairly Groomed  Eye Contact:  Good  Speech:  Clear and Coherent and Normal Rate  Volume:  Normal  Mood:  Anxious and Depressed  Affect:  Appropriate, Congruent and Constricted  Thought Process:  Coherent and Goal Directed  Orientation:  Full (Time, Place, and Person)  Thought Content:  Logical  Suicidal Thoughts:  No  Homicidal Thoughts:  No  Memory:  Immediate;   Fair Recent;   Fair Remote;   Fair  Judgement:  Fair  Insight:  Fair  Psychomotor Activity:  Normal  Concentration:  Concentration: Fair  Recall:  [employer] of Knowledge:  Fair  Language:  Fair  Akathisia:  No  Handed:    AIMS (if indicated):     Assets:  Resilience Social Support  ADL's:  Intact  Cognition:  WNL  Sleep:  Number of Hours: 6.75    Treatment Plan Summary: Daily contact with patient to assess and evaluate symptoms and progress in treatment and Medication management   -Continue inpatient hospitalization  -Schizophrenia             -Continue risperdal 2mg  po qhs             -Continue remeron 15mg  po qhs  -EPS              -Continue cogentin 1mg  po/IM q12h prn EPS  -microcytic anemia             -Continue ferrous sulfate 325mg  po TID with meals  -agitation                      -Continue haldol 5mg  po/IM q6h prn agitation             -Continue vistaril 1mg  po/IM q6h prn agitation  -anxiety             -Continue vistaril 25mg   po q6h prn anxiety  -Encourage participation in groups and therapeutic milieu  -disposition planning will be  ongoing  Micheal Likens, MD 09/01/2017, 11:51 AM

## 2017-09-02 DIAGNOSIS — G259 Extrapyramidal and movement disorder, unspecified: Secondary | ICD-10-CM

## 2017-09-02 DIAGNOSIS — D649 Anemia, unspecified: Secondary | ICD-10-CM

## 2017-09-02 MED ORDER — IBUPROFEN 600 MG PO TABS
600.0000 mg | ORAL_TABLET | Freq: Four times a day (QID) | ORAL | Status: DC | PRN
  Administered 2017-09-02: 600 mg via ORAL
  Filled 2017-09-02: qty 1

## 2017-09-02 NOTE — [facility] Group Notes (Signed)
[facility] Group Notes:  (Nursing/MHT/Case Management/Adjunct)  Date:  09/02/2017  Time:  9:36 AM  Type of Therapy:  orientation and goals group  Participation Level:  Active  Participation Quality:  Appropriate  Affect:  Appropriate  Cognitive:  Appropriate  Insight:  Appropriate and Good  Engagement in Group:  Engaged and Supportive  Modes of Intervention:  Discussion, Education and Orientation  Summary of Progress/Problems: Her goal for today is to work towards her discharge.   Mykaela Arena J Shaquera Ansley 09/02/2017, 9:36 AM

## 2017-09-02 NOTE — Progress Notes (Signed)
Pt very reluctant and paranoid about taking medication, pt needed much coaxing to take medication.

## 2017-09-02 NOTE — Plan of Care (Deleted)
D: Pt denies SI/HI/AVH. Pt is pleasant and cooperative. Pt  A: Pt was offered support and encouragement. Pt was given scheduled medications. Pt was encourage to attend groups. Q 15 minute checks were done for safety.  R:Pt attends groups and interacts well with peers and staff. Pt is taking medication. Pt has no complaints.Pt receptive to treatment and safety maintained on unit.   Problem: Education: Goal: Emotional status will improve Outcome: Progressing   Problem: Education: Goal: Mental status will improve Outcome: Progressing   Problem: Activity: Goal: Sleeping patterns will improve Outcome: Progressing   Problem: Safety: Goal: Periods of time without injury will increase Outcome: Progressing

## 2017-09-02 NOTE — Progress Notes (Signed)
Recreation Therapy Notes  Date: 8.28.19 Time: 0930 Location: [address] Dayroom  Group Topic: Stress Management  Goal Area(s) Addresses:  Patient will verbalize importance of using healthy stress management.  Patient will identify positive emotions associated with healthy stress management.   Behavioral Response: Engaged  Intervention: Scientist, clinical (histocompatibility and immunogenetics)Construction paper, markers  Activity :  Patients were given a sheet of construction paper and a marker.  Patients were to trace their hands on the paper.  On the right hand, patients were to write down the things that cause them stress.  On the the left hand, they were to write all positive coping skills they use to help them deal with their stresses.  LRT then lead group in a meditation.  Education: Stress Management, Discharge Planning.   Education Outcome: Acknowledges edcuation/In group clarification offered/Needs additional education  Clinical Observations/Feedback: Pt left early with PA but later returned near the end of group.  Pt completed the stressors and coping skills while he meditation played.     Caroll RancherMarjette Shanee Batch, LRT/CTRS         Lillia AbedLindsay, Shivali Quackenbush A 09/02/2017 11:51 AM

## 2017-09-02 NOTE — Progress Notes (Signed)
D: Pt denies SI/HI/AVH. Pt is pleasant and cooperative. Pt presents very paranoid. Pt stated she felt better, worse and the same at times. Pt complained of back pain 5/ 10 . Pt stated Tylenol won't work, per Barbara CowerJason NP pt offered Ibuprofen per MAR.  A: Pt was offered support and encouragement. Pt was given scheduled medications. Pt was encourage to attend groups. Q 15 minute checks were done for safety.  R:Pt attends groups and interacts well with peers and staff. Pt is taking medication. Pt receptive to treatment and safety maintained on unit.   Problem: Education: Goal: Emotional status will improve Outcome: Progressing   Problem: Education: Goal: Mental status will improve Outcome: Progressing   Problem: Activity: Goal: Sleeping patterns will improve Outcome: Progressing   Problem: Safety: Goal: Periods of time without injury will increase Outcome: Progressing

## 2017-09-02 NOTE — Progress Notes (Signed)
[facility] MD Progress Note  09/02/2017 12:57 PM Kelsey Gray  MRN:  [id number]  Subjective: Berenize reports, "I'm doing well. I think what I need is an outpatient clinic & a provider. I don't need to be in the hospital. It was my sister that started this all together. She is always thinking that I'm endanger to my daughter because I drove from [place] to [place] with my daughter at an odd hour. I need to talk about all of this with my doctor on an outpatient clinic. I'm stressed because I am dealing with a lot of stressors, financial difficulties & driving a long distance to work from [place] to [place]. I'm doing well, only that I was involuntarily committed to come here. I don't know how this will affect my parental right & job"    As per intake SRA: 42 year old female, divorced, has 30 year old daughter, unemployed . Lives in [place] Patient presents as fair historian, states she has been feeling " overworked" and describes feeling overwhelmed due to limited social support and limited resources . States she went to her mother's for help/assistance but that it did not go well . Reports she decided to come to [facility] and presented to ED Initially reporting physical symptoms ( sore throat, rectal bleeding). Patient presented guarded, paranoid,and as per ED notes, sister and mother provided collateral information reporting that patient has been paranoid, feeling she is being followed, convinced her phone is being tapped, attempting to communicate in code with her sister, not eating with concerns food is poisoned, not sleeping. Currently patient presents guarded, fearful, states that unit soap is making her feel sick and expressing concerns about the ice in her water . She denies depression, but states she has been overwhelmed. Reports she has not been eating well because " I don't have a lot of money". Denies suicidal ideations. States her child is now in the custody of the child's  father. Denies prior psychiatric admissions , denies history of depression, mania or of psychosis. Denies history of suicide attempts.  Was not taking medications prior to admission.  Today, Kelsey Gray is seen, chart reviewed. The chart findings discussed with the treatment team. She present alert, oriented & aware of situation. She is making good eye contact, however, presents with sad/depressed affect. She reports, "I'm doing well today" She reports sleeping well, has a appetite good. She denies any physical complaints aside from some drowsiness she felt yesterday, but says it is better today. She is visible on the unit, attending group sessions. She denies paranoia today, however, is entertaining some fear about being involuntarily committed to the hospital & how it will affect her effort to keep & care for her 19 year old daughter. She denies SI/HI/AH/VH. She is tolerating her medications well overall and she feels that they have been helpful. She is in agreement to continue her current regimen without changes. She had no further questions, comments, or concerns. She says she has a question for Rod about a form he gave her to sign.  Principal Problem: Schizophrenia (HCC) Diagnosis:   Patient Active Problem List   Diagnosis Date Noted  . Microcytic anemia [D50.9] 08/27/2017  . Schizophrenia (HCC) [F20.9] 08/27/2017   Total Time spent with patient: 15 minutes  Past Psychiatric History: See H&P  Past Medical History:  Past Medical History:  Diagnosis Date  . Medical history non-contributory    History reviewed. No pertinent surgical history.  Family History: History reviewed. No pertinent family history.  Family Psychiatric  History: See H&P  Social History:  Social History   Substance and Sexual Activity  Alcohol Use Not Currently     Social History   Substance and Sexual Activity  Drug Use Never    Social History   Socioeconomic History  . Marital status: Single    Spouse name:  Not on file  . Number of children: Not on file  . Years of education: Not on file  . Highest education level: Not on file  Occupational History  . Not on file  Social Needs  . Financial resource strain: Not on file  . Food insecurity:    Worry: Not on file    Inability: Not on file  . Transportation needs:    Medical: Not on file    Non-medical: Not on file  Tobacco Use  . Smoking status: Never Smoker  . Smokeless tobacco: Never Used  Substance and Sexual Activity  . Alcohol use: Not Currently  . Drug use: Never  . Sexual activity: Not on file  Lifestyle  . Physical activity:    Days per week: Not on file    Minutes per session: Not on file  . Stress: Not on file  Relationships  . Social connections:    Talks on phone: Not on file    Gets together: Not on file    Attends religious service: Not on file    Active member of club or organization: Not on file    Attends meetings of clubs or organizations: Not on file    Relationship status: Not on file  Other Topics Concern  . Not on file  Social History Narrative  . Not on file   Additional Social History:   Sleep: Good  Appetite:  Good  Current Medications: Current Facility-Administered Medications  Medication Dose Route Frequency Provider Last Rate Last Dose  . acetaminophen (TYLENOL) tablet 650 mg  650 mg Oral Q6H PRN Truman Hayward, FNP      . alum & mag hydroxide-simeth (MAALOX/MYLANTA) 200-200-20 MG/5ML suspension 30 mL  30 mL Oral Q4H PRN Starkes, Takia S, FNP      . benztropine (COGENTIN) tablet 1 mg  1 mg Oral BID PRN Micheal Likens, MD       Or  . benztropine mesylate (COGENTIN) injection 1 mg  1 mg Intramuscular BID PRN Micheal Likens, MD      . feeding supplement (ENSURE ENLIVE) (ENSURE ENLIVE) liquid 237 mL  237 mL Oral BID BM Oneta Rack, NP   237 mL at 09/01/17 1315  . ferrous sulfate tablet 325 mg  325 mg Oral TID WC Oneta Rack, NP   325 mg at 09/02/17 5621  .  haloperidol (HALDOL) tablet 5 mg  5 mg Oral Q6H PRN Micheal Likens, MD       Or  . haloperidol lactate (HALDOL) injection 5 mg  5 mg Intramuscular Q6H PRN Micheal Likens, MD      . hydrOXYzine (ATARAX/VISTARIL) tablet 25 mg  25 mg Oral Q6H PRN Cobos, Rockey Situ, MD      . LORazepam (ATIVAN) tablet 1 mg  1 mg Oral Q6H PRN Cobos, Rockey Situ, MD       Or  . LORazepam (ATIVAN) injection 1 mg  1 mg Intramuscular Q6H PRN Cobos, Fernando A, MD      . magnesium hydroxide (MILK OF MAGNESIA) suspension 30 mL  30 mL Oral Daily PRN Truman Hayward, FNP      .  mirtazapine (REMERON) tablet 15 mg  15 mg Oral QHS Micheal Likensainville, Christopher T, MD   15 mg at 09/01/17 2115  . risperiDONE (RISPERDAL) tablet 2 mg  2 mg Oral QHS Micheal Likensainville, Christopher T, MD   2 mg at 09/01/17 2115  . senna-docusate (Senokot-S) tablet 1 tablet  1 tablet Oral BID Oneta RackLewis, Tanika N, NP   1 tablet at 09/02/17 0725   Lab Results:  No results found for this or any previous visit (from the past 48 hour(s)).  Blood Alcohol level:  Lab Results  Component Value Date   ETH <10 08/26/2017   Metabolic Disorder Labs: Lab Results  Component Value Date   HGBA1C 5.2 08/30/2017   MPG 102.54 08/30/2017   Lab Results  Component Value Date   PROLACTIN 90.0 (H) 08/30/2017   Lab Results  Component Value Date   CHOL 181 08/30/2017   TRIG 73 08/30/2017   HDL 65 08/30/2017   CHOLHDL 2.8 08/30/2017   VLDL 15 08/30/2017   LDLCALC 101 (H) 08/30/2017    Physical Findings: AIMS:  , ,  ,  ,    CIWA:    COWS:     Musculoskeletal: Strength & Muscle Tone: within normal limits Gait & Station: normal Patient leans: N/A  Psychiatric Specialty Exam: Physical Exam  Nursing note and vitals reviewed.   Review of Systems  Constitutional: Positive for malaise/fatigue. Negative for chills and fever.  Respiratory: Negative for cough and shortness of breath.   Cardiovascular: Negative for chest pain.  Gastrointestinal: Negative  for abdominal pain, heartburn, nausea and vomiting.  Psychiatric/Behavioral: Negative for depression, hallucinations and suicidal ideas. The patient is not nervous/anxious and does not have insomnia.     Blood pressure 93/69, pulse 96, temperature 98.3 F (36.8 C), temperature source Oral, resp. rate 16, height 5\' 4"  (1.626 m), SpO2 100 %.There is no height or weight on file to calculate BMI.  General Appearance: Casual and Fairly Groomed  Eye Contact:  Good  Speech:  Clear and Coherent and Normal Rate  Volume:  Normal  Mood:  Anxious and Depressed  Affect:  Appropriate, Congruent and Constricted  Thought Process:  Coherent and Goal Directed  Orientation:  Full (Time, Place, and Person)  Thought Content:  Logical  Suicidal Thoughts:  No  Homicidal Thoughts:  No  Memory:  Immediate;   Fair Recent;   Fair Remote;   Fair  Judgement:  Fair  Insight:  Fair  Psychomotor Activity:  Normal  Concentration:  Concentration: Fair  Recall:  FiservFair  Fund of Knowledge:  Fair  Language:  Fair  Akathisia:  No  Handed:    AIMS (if indicated):     Assets:  Resilience Social Support  ADL's:  Intact  Cognition:  WNL  Sleep:  Number of Hours: 6.75   Treatment Plan Summary: Daily contact with patient to assess and evaluate symptoms and progress in treatment and Medication management   -Continue inpatient hospitalization.  -Will continue today 09/02/2017 plan as below except where it is noted.  -Schizophrenia             -Continue risperdal 2mg  po qhs             -Continue remeron 15mg  po qhs  -EPS              -Continue cogentin 1mg  po/IM q12h prn EPS  -microcytic anemia             -Continue ferrous sulfate 325mg  po TID with meals  -  agitation                      -Continue haldol 5mg  po/IM q6h prn agitation             -Continue vistaril 1mg  po/IM q6h prn agitation  -anxiety             -Continue vistaril 25mg  po q6h prn anxiety  -Encourage participation in groups and  therapeutic milieu  -disposition planning will be ongoing  Armandina Stammer, NP, PMHNP, FNP-BC 09/02/2017, 12:57 PMPatient ID: Kelsey Gray, female   DOB: 24-Feb-1975, 42 y.o.   MRN: [id number]

## 2017-09-02 NOTE — Progress Notes (Signed)
Adult Psychoeducational Group Note  Date:  09/02/2017 Time:  8:46 PM  Group Topic/Focus:  Wrap-Up Group:   The focus of this group is to help patients review their daily goal of treatment and discuss progress on daily workbooks.  Participation Level:  Active  Participation Quality:  Appropriate  Affect:  Appropriate  Cognitive:  Appropriate  Insight: Appropriate  Engagement in Group:  Engaged  Modes of Intervention:  Discussion  Additional Comments:  The patient expressed that she attended groups and rates today a 8.  Octavio Mannshigpen, Zenovia Justman Lee 09/02/2017, 8:46 PM

## 2017-09-02 NOTE — Progress Notes (Signed)
Patient denies SI, HI and AVH this shift.  Patient has reported a decrease in paranoid and suspicious behaviors.   Patient has attended groups engaged in unit activities and had no behavioral dyscontrol.   Assess patient for safety, offer medications as prescribed, engage patient in 1:1 staff talk.  Continue to monitor as planned.  Patient able to contract for safety.

## 2017-09-02 NOTE — BHH Group Notes (Signed)
LCSW Group Therapy Notes 09/02/2017 1:15pm Type of Therapy and Topic:  Group Therapy:  Communication Participation Level:  Active  Description of Group: Patients will identify how individuals communicate with one another appropriately and inappropriately.  Patients will be guided to discuss their thoughts, feelings and behaviors related to barriers when communicating.  The group will process together ways to execute positive and appropriate communication with attention given to how one uses behavior, tone and body language.  Patients will be encouraged to reflect on a situation where they were successfully able to communicate and what made this example successful.  Group will identify specific changes they are motivated to make in order to overcome communication barriers with self, peers, authority, and parents.  This group will be process-oriented with patients participating in exploration of their own experiences, giving and receiving support, and challenging self and other group members.   Therapeutic Goals 1. Patient will identify how people communicate (body language, facial expression, and electronics).  Group will also discuss tone, voice and how these impact what is communicated and what is received. 2. Patient will identify feelings (such as fear or worry), thought process and behaviors related to why people internalize feelings rather than express self openly. 3. Patient will identify two changes they are willing to make to overcome communication barriers 4. Members will then practice through role play how to communicate using I statements, I feel statements, and acknowledging feelings rather than displacing feelings on others Summary of Patient Progress:  Stayed the entire time, engaged throughout.  Cited her mother and her church family as good supports.  However, since [place] in [place], will not have the direct support of her church family, though she states she has  been in touch with some friends by phone.   Therapeutic Modalities Cognitive Behavioral Therapy Motivational Interviewing Solution Focused Therapy  Kelsey Gray, [place] 09/02/2017 4:42 PM

## 2017-09-03 NOTE — BHH Group Notes (Signed)
LCSW Group Therapy Note  09/03/2017 1:15pm  Type of Therapy/Topic:  Group Therapy:  Emotion Regulation  Participation Level:  Active   Description of Group:   The purpose of this group is to assist patients in learning to regulate negative emotions and experience positive emotions. Patients will be guided to discuss ways in which they have been vulnerable to their negative emotions. These vulnerabilities will be juxtaposed with experiences of positive emotions or situations, and patients will be challenged to use positive emotions to combat negative ones. Special emphasis will be placed on coping with negative emotions in conflict situations, and patients will process healthy conflict resolution skills.  Therapeutic Goals: 1. Patient will identify two positive emotions or experiences to reflect on in order to balance out negative emotions 2. Patient will label two or more emotions that they find the most difficult to experience 3. Patient will demonstrate positive conflict resolution skills through discussion and/or role plays  Summary of Patient Progress: Kelsey Gray described herself as being balanced. She knew this because she had received bad news but was "putting this in perspective". She also stated that she is not overreacting. She uses walking with a purpose to help her maintain emotional regulation.      Therapeutic Modalities:   Cognitive Behavioral Therapy Feelings Identification Dialectical Behavioral Therapy   Ida RogueRodney B Nicolena Schurman, [place]CS[place] 09/03/2017 2:46 PM

## 2017-09-03 NOTE — Plan of Care (Signed)
Progress Note  D: pt observed in groups attentive and interacting. Pt states she slept well. Pt denies any depression/hopelessness/anxiety rating these all 0/10. Pt denies any physical pain, rating this a 0/10. Pt states her goal for today is to be discharged and will achieve this by following the nurse, social worker, and doctors instructions.  Pt is stated as denying any si/hi/ah/vh and verbally agrees to approach staff if these become apparent.  A: pt provided support and encouragement. Pt provided medications not by this writer: pt compliant. [id number] safety checks implemented and continued. R: pt safe on the unit. Will continue to monitor.   Pt progressing in the following metrics  Problem: Education: Goal: Knowledge of [facility] Education information/materials will improve Outcome: Progressing Goal: Verbalization of understanding the information provided will improve Outcome: Progressing   Problem: Coping: Goal: Ability to verbalize frustrations and anger appropriately will improve Outcome: Progressing Goal: Ability to demonstrate self-control will improve Outcome: Progressing   Problem: Health Behavior/Discharge Planning: Goal: Identification of resources available to assist in meeting health care needs will improve Outcome: Progressing Goal: Compliance with treatment plan for underlying cause of condition will improve Outcome: Progressing   Problem: Physical Regulation: Goal: Ability to maintain clinical measurements within normal limits will improve Outcome: Progressing   Problem: Coping: Goal: Ability to use eye contact when communicating with others will improve Outcome: Progressing

## 2017-09-03 NOTE — Tx Team (Signed)
Interdisciplinary Treatment and Diagnostic Plan Update  09/03/2017 Time of Session: 2:53 PM  Kelsey Gray MRN: [id number]  Principal Diagnosis: Schizophrenia [facility])  Secondary Diagnoses: Principal Problem:   Schizophrenia ([place])   Current Medications:  Current Facility-Administered Medications  Medication Dose Route Frequency Provider Last Rate Last Dose  . acetaminophen (TYLENOL) tablet 650 mg  650 mg Oral Q6H PRN Nanci Pina, FNP      . alum & mag hydroxide-simeth (MAALOX/MYLANTA) 200-200-20 MG/5ML suspension 30 mL  30 mL Oral Q4H PRN Starkes, Takia S, FNP      . benztropine (COGENTIN) tablet 1 mg  1 mg Oral BID PRN Pennelope Bracken, MD       Or  . benztropine mesylate (COGENTIN) injection 1 mg  1 mg Intramuscular BID PRN Pennelope Bracken, MD      . feeding supplement (ENSURE ENLIVE) (ENSURE ENLIVE) liquid 237 mL  237 mL Oral BID BM Derrill Center, NP   237 mL at 09/01/17 1315  . ferrous sulfate tablet 325 mg  325 mg Oral TID WC Derrill Center, NP   325 mg at 09/03/17 1207  . haloperidol (HALDOL) tablet 5 mg  5 mg Oral Q6H PRN Pennelope Bracken, MD       Or  . haloperidol lactate (HALDOL) injection 5 mg  5 mg Intramuscular Q6H PRN Pennelope Bracken, MD      . hydrOXYzine (ATARAX/VISTARIL) tablet 25 mg  25 mg Oral Q6H PRN Cobos, Myer Peer, MD      . ibuprofen (ADVIL,MOTRIN) tablet 600 mg  600 mg Oral Q6H PRN Lindon Romp A, NP   600 mg at 09/02/17 2157  . LORazepam (ATIVAN) tablet 1 mg  1 mg Oral Q6H PRN Cobos, Myer Peer, MD       Or  . LORazepam (ATIVAN) injection 1 mg  1 mg Intramuscular Q6H PRN Cobos, Fernando A, MD      . magnesium hydroxide (MILK OF MAGNESIA) suspension 30 mL  30 mL Oral Daily PRN Starkes, Takia S, FNP      . mirtazapine (REMERON) tablet 15 mg  15 mg Oral QHS Pennelope Bracken, MD   15 mg at 09/02/17 2157  . risperiDONE (RISPERDAL) tablet 2 mg  2 mg Oral QHS Pennelope Bracken, MD   2 mg at 09/02/17 2157  .  senna-docusate (Senokot-S) tablet 1 tablet  1 tablet Oral BID Derrill Center, NP   1 tablet at 09/03/17 0160    PTA Medications: No medications prior to admission.    Patient Stressors: Marital or family conflict Occupational concerns  Patient Strengths: Average or above average intelligence Communication skills General fund of knowledge Supportive family/friends  Treatment Modalities: Medication Management, Group therapy, Case management,  1 to 1 session with clinician, Psychoeducation, Recreational therapy.   Physician Treatment Plan for Primary Diagnosis: Schizophrenia ([place]) Long Term Goal(s): Improvement in symptoms so as ready for discharge  Short Term Goals: Ability to identify changes in lifestyle to reduce recurrence of condition will improve Ability to verbalize feelings will improve Ability to maintain clinical measurements within normal limits will improve Compliance with prescribed medications will improve Ability to verbalize feelings will improve Ability to demonstrate self-control will improve Ability to identify and develop effective coping behaviors will improve Compliance with prescribed medications will improve  Medication Management: Evaluate patient's response, side effects, and tolerance of medication regimen.  Therapeutic Interventions: 1 to 1 sessions, Unit Group sessions and Medication administration.  Evaluation of Outcomes: Adequate  for Discharge  Physician Treatment Plan for Secondary Diagnosis: Principal Problem:   Schizophrenia ([place])   Long Term Goal(s): Improvement in symptoms so as ready for discharge  Short Term Goals: Ability to identify changes in lifestyle to reduce recurrence of condition will improve Ability to verbalize feelings will improve Ability to maintain clinical measurements within normal limits will improve Compliance with prescribed medications will improve Ability to verbalize feelings will improve Ability to  demonstrate self-control will improve Ability to identify and develop effective coping behaviors will improve Compliance with prescribed medications will improve  Medication Management: Evaluate patient's response, side effects, and tolerance of medication regimen.  Therapeutic Interventions: 1 to 1 sessions, Unit Group sessions and Medication administration.  Evaluation of Outcomes: Adequate for Discharge   RN Treatment Plan for Primary Diagnosis: Schizophrenia ([place]) Long Term Goal(s): Knowledge of disease and therapeutic regimen to maintain health will improve  Short Term Goals: Ability to identify and develop effective coping behaviors will improve and Compliance with prescribed medications will improve  Medication Management: RN will administer medications as ordered by provider, will assess and evaluate patient's response and provide education to patient for prescribed medication. RN will report any adverse and/or side effects to prescribing provider.  Therapeutic Interventions: 1 on 1 counseling sessions, Psychoeducation, Medication administration, Evaluate responses to treatment, Monitor vital signs and CBGs as ordered, Perform/monitor CIWA, COWS, AIMS and Fall Risk screenings as ordered, Perform wound care treatments as ordered.  Evaluation of Outcomes: Adequate for Discharge   LCSW Treatment Plan for Primary Diagnosis: Schizophrenia [facility]) Long Term Goal(s): Safe transition to appropriate next level of care at discharge, Engage patient in therapeutic group addressing interpersonal concerns.  Short Term Goals: Engage patient in aftercare planning with referrals and resources  Therapeutic Interventions: Assess for all discharge needs, 1 to 1 time with Social worker, Explore available resources and support systems, Assess for adequacy in community support network, Educate family and significant other(s) on suicide prevention, Complete Psychosocial Assessment, Interpersonal group  therapy.  Evaluation of Outcomes: Met Return home, follow up outpt   Progress in Treatment: Attending groups: Yes Participating in groups: Yes Taking medication as prescribed: Yes Toleration medication: Yes, no side effects reported at this time Family/Significant other contact made: Yes Patient understands diagnosis: Yes AEB asking for help with mental health sympotms Discussing patient identified problems/goals with staff: Yes Medical problems stabilized or resolved: Yes Denies suicidal/homicidal ideation: Yes Issues/concerns per patient self-inventory: None Other: N/A  New problem(s) identified: None identified at this time.   New Short Term/Long Term Goal(s): "I'll give some examples to help you understand. I want to be less nervous when I am talking with people. I want more regular sleep and a regular eating schedule."  Discharge Plan or Barriers:   Reason for Continuation of Hospitalization:  Medication stabilization   Estimated Length of Stay: [facility] d/c tomorrow  Attendees: Patient:  09/03/2017  2:53 PM  Physician: Maris Berger, MD 09/03/2017  2:53 PM  Nursing: Sena Hitch, RN 09/03/2017  2:53 PM  RN Care Manager: Lars Pinks, RN 09/03/2017  2:53 PM  Social Worker: Ripley Fraise 09/03/2017  2:53 PM  Recreational Therapist: Winfield Cunas 09/03/2017  2:53 PM  Other: Norberto Sorenson 09/03/2017  2:53 PM  Other:  09/03/2017  2:53 PM    Scribe for Treatment Team:  Roque Lias LCSW 09/03/2017 2:53 PM

## 2017-09-03 NOTE — [facility] Group Notes (Signed)
Adult Psychoeducational Group Note  Date:  09/03/2017 Time:  9:25 PM  Group Topic/Focus:  Wrap-Up Group:   The focus of this group is to help patients review their daily goal of treatment and discuss progress on daily workbooks.  Participation Level:  Active  Participation Quality:  Appropriate and Attentive  Affect:  Appropriate  Cognitive:  Alert and Appropriate  Insight: Appropriate and Good  Engagement in Group:  Engaged  Modes of Intervention:  Discussion and Education  Additional Comments:  Pt attended and participated in wrap up group this evening.. Pt rated their day a 9/10, due to them talking to the Dr and being discharged tomorrow. Pt completed their goal, which was to be discharged.    Chrisandra NettersOctavia A Declan Adamson 09/03/2017, 9:25 PM

## 2017-09-03 NOTE — Progress Notes (Signed)
[facility] MD Progress Note  09/03/2017 1:13 PM Kelsey Gray  MRN:  [id number] Subjective:    As per intake SRA: 42 year old female, divorced, has 17 year old daughter, unemployed . Lives in [place] Patient presents as fair historian, states she has been feeling " overworked" and describes feeling overwhelmed due to limited social support and limited resources . States she went to her mother's for help/assistance but that it did not go well . Reports she decided to come to [facility] and presented to ED Initially reporting physical symptoms ( sore throat, rectal bleeding). Patient presented guarded, paranoid,and as per ED notes, sister and mother provided collateral information reporting that patient has been paranoid, feeling she is being followed, convinced her phone is being tapped, attempting to communicate in code with her sister, not eating with concerns food is poisoned, not sleeping. Currently patient presents guarded, fearful, states that unit soap is making her feel sick and expressing concerns about the ice in her water . She denies depression, but states she has been overwhelmed. Reports she has not been eating well because " I don't have a lot of money". Denies suicidal ideations. States her child is now in the custody of the child's father. Denies prior psychiatric admissions , denies history of depression, mania or of psychosis. Denies history of suicide attempts.  Was not taking medications prior to admission.  Today upon evaluation: Pt shares, "Things are going good - much better than before." Pt denies any specific concerns aside from seeking her discharge. She is sleeping well. Her appetite is good. She denies SI/HI/AH/VH. She is tolerating her medications well. She agrees to continue her current regimen without changes. Pt continues to endorse some mild delusional/paranoid content. She denies feeling concerned that the food is poisoned, but she describes having "wonderings" and she  provides example that she felt something was "off" about the creamer supplied on the unit; however, this is not impairing her ability to participate on the unit and she does not believe it would her ability to feel safe and function outside the hospital. She plans to stay with her mother after discharge. We will anticipate likely discharge to outpatient level of care tomorrow. She had no further questions, comments, or concerns.   Principal Problem: Schizophrenia (HCC) Diagnosis:   Patient Active Problem List   Diagnosis Date Noted  . Microcytic anemia [D50.9] 08/27/2017  . Schizophrenia (HCC) [F20.9] 08/27/2017   Total Time spent with patient: 30 minutes  Past Psychiatric History: see H&P  Past Medical History:  Past Medical History:  Diagnosis Date  . Medical history non-contributory    History reviewed. No pertinent surgical history. Family History: History reviewed. No pertinent family history. Family Psychiatric  History: see H&P Social History:  Social History   Substance and Sexual Activity  Alcohol Use Not Currently     Social History   Substance and Sexual Activity  Drug Use Never    Social History   Socioeconomic History  . Marital status: Single    Spouse name: Not on file  . Number of children: Not on file  . Years of education: Not on file  . Highest education level: Not on file  Occupational History  . Not on file  Social Needs  . Financial resource strain: Not on file  . Food insecurity:    Worry: Not on file    Inability: Not on file  . Transportation needs:    Medical: Not on file    Non-medical: Not on  file  Tobacco Use  . Smoking status: Never Smoker  . Smokeless tobacco: Never Used  Substance and Sexual Activity  . Alcohol use: Not Currently  . Drug use: Never  . Sexual activity: Not on file  Lifestyle  . Physical activity:    Days per week: Not on file    Minutes per session: Not on file  . Stress: Not on file  Relationships  .  Social connections:    Talks on phone: Not on file    Gets together: Not on file    Attends religious service: Not on file    Active member of club or organization: Not on file    Attends meetings of clubs or organizations: Not on file    Relationship status: Not on file  Other Topics Concern  . Not on file  Social History Narrative  . Not on file   Additional Social History:                         Sleep: Good  Appetite:  Good  Current Medications: Current Facility-Administered Medications  Medication Dose Route Frequency Provider Last Rate Last Dose  . acetaminophen (TYLENOL) tablet 650 mg  650 mg Oral Q6H PRN Truman Hayward, FNP      . alum & mag hydroxide-simeth (MAALOX/MYLANTA) 200-200-20 MG/5ML suspension 30 mL  30 mL Oral Q4H PRN Starkes, Takia S, FNP      . benztropine (COGENTIN) tablet 1 mg  1 mg Oral BID PRN Micheal Likens, MD       Or  . benztropine mesylate (COGENTIN) injection 1 mg  1 mg Intramuscular BID PRN Micheal Likens, MD      . feeding supplement (ENSURE ENLIVE) (ENSURE ENLIVE) liquid 237 mL  237 mL Oral BID BM Oneta Rack, NP   237 mL at 09/01/17 1315  . ferrous sulfate tablet 325 mg  325 mg Oral TID WC Oneta Rack, NP   325 mg at 09/03/17 1207  . haloperidol (HALDOL) tablet 5 mg  5 mg Oral Q6H PRN Micheal Likens, MD       Or  . haloperidol lactate (HALDOL) injection 5 mg  5 mg Intramuscular Q6H PRN Micheal Likens, MD      . hydrOXYzine (ATARAX/VISTARIL) tablet 25 mg  25 mg Oral Q6H PRN Cobos, Rockey Situ, MD      . ibuprofen (ADVIL,MOTRIN) tablet 600 mg  600 mg Oral Q6H PRN Nira Conn A, NP   600 mg at 09/02/17 2157  . LORazepam (ATIVAN) tablet 1 mg  1 mg Oral Q6H PRN Cobos, Rockey Situ, MD       Or  . LORazepam (ATIVAN) injection 1 mg  1 mg Intramuscular Q6H PRN Cobos, Fernando A, MD      . magnesium hydroxide (MILK OF MAGNESIA) suspension 30 mL  30 mL Oral Daily PRN Starkes, Takia S, FNP      .  mirtazapine (REMERON) tablet 15 mg  15 mg Oral QHS Micheal Likens, MD   15 mg at 09/02/17 2157  . risperiDONE (RISPERDAL) tablet 2 mg  2 mg Oral QHS Micheal Likens, MD   2 mg at 09/02/17 2157  . senna-docusate (Senokot-S) tablet 1 tablet  1 tablet Oral BID Oneta Rack, NP   1 tablet at 09/03/17 8413    Lab Results: No results found for this or any previous visit (from the past 48 hour(s)).  Blood Alcohol  level:  Lab Results  Component Value Date   ETH <10 08/26/2017    Metabolic Disorder Labs: Lab Results  Component Value Date   HGBA1C 5.2 08/30/2017   MPG 102.54 08/30/2017   Lab Results  Component Value Date   PROLACTIN 90.0 (H) 08/30/2017   Lab Results  Component Value Date   CHOL 181 08/30/2017   TRIG 73 08/30/2017   HDL 65 08/30/2017   CHOLHDL 2.8 08/30/2017   VLDL 15 08/30/2017   LDLCALC 101 (H) 08/30/2017    Physical Findings: AIMS:  , ,  ,  ,    CIWA:    COWS:     Musculoskeletal: Strength & Muscle Tone: within normal limits Gait & Station: normal Patient leans: N/A  Psychiatric Specialty Exam: Physical Exam  Nursing note and vitals reviewed.   Review of Systems  Constitutional: Negative for chills and fever.  Respiratory: Negative for cough and shortness of breath.   Cardiovascular: Negative for chest pain.  Gastrointestinal: Negative for abdominal pain, heartburn, nausea and vomiting.  Psychiatric/Behavioral: Negative for depression, hallucinations and suicidal ideas. The patient is not nervous/anxious and does not have insomnia.     Blood pressure 100/65, pulse 96, temperature 98.2 F (36.8 C), temperature source Oral, resp. rate 16, height 5\' 4"  (1.626 m), SpO2 100 %.There is no height or weight on file to calculate BMI.  General Appearance: Casual  Eye Contact:  Good  Speech:  Clear and Coherent and Normal Rate  Volume:  Normal  Mood:  Anxious and Euthymic  Affect:  Appropriate, Congruent, Constricted and Flat  Thought  Process:  Coherent and Goal Directed  Orientation:  Full (Time, Place, and Person)  Thought Content:  Delusions, Ideas of Reference:   Paranoia Delusions and Paranoid Ideation  Suicidal Thoughts:  No  Homicidal Thoughts:  No  Memory:  Immediate;   Fair Recent;   Fair Remote;   Fair  Judgement:  Fair  Insight:  Lacking  Psychomotor Activity:  Normal  Concentration:  Concentration: Fair  Recall:  FiservFair  Fund of Knowledge:  Fair  Language:  Fair  Akathisia:  No  Handed:    AIMS (if indicated):     Assets:  Resilience Social Support  ADL's:  Intact  Cognition:  WNL  Sleep:  Number of Hours: 6.5   Treatment Plan Summary: Daily contact with patient to assess and evaluate symptoms and progress in treatment and Medication management   -Continue inpatient hospitalization.  -Will continue today 09/03/2017 plan as below except where it is noted.  -Schizophrenia -Continue risperdal 2mg  po qhs -Continue remeron 15mg  po qhs  -EPS -Continue cogentin 1mg  po/IM q12h prn EPS  -microcytic anemia -Continue ferrous sulfate 325mg  po TID with meals  -agitation -Continue haldol 5mg  po/IM q6h prn agitation -Continue vistaril 1mg  po/IM q6h prn agitation  -anxiety -Continue vistaril 25mg  po q6h prn anxiety  -Encourage participation in groups and therapeutic milieu  -disposition planning will be ongoing  Micheal Likenshristopher T Markanthony Gedney, MD 09/03/2017, 1:13 PM

## 2017-09-03 NOTE — Progress Notes (Signed)
Recreation Therapy Notes  Date: 09/03/17 Time: 1000 Location: 500 Hall  Group Topic: Wellness  Goal Area(s) Addresses:  Patient will define components of whole wellness. Patient will verbalize benefit of whole wellness.  Behavioral Response: Engaged  Intervention: Exercise  Activity: Exercise.  LRT introduced the concept of wellness to patients.  LRT had patients meet in the hallway.  LRT then lead patients in a series of exercises such as walking laps, jogging in place, lunges, elbows to knees and stretching.  Education:Wellness, Discharge Planning.   Education Outcome: Acknowledges education/In group clarification offered/Needs additional education.   Clinical Observations/Feedback: Pt was appropriate and engaged in group.  Pt completed all the exercises.   Caroll RancherMarjette Hadlee Burback, LRT/CTRS        Caroll RancherLindsay, Deshawnda Acrey A 09/03/2017 12:13 PM

## 2017-09-03 NOTE — [facility] Group Notes (Signed)
Pt did not attend orientation/goals group. 

## 2017-09-04 MED ORDER — MIRTAZAPINE 15 MG PO TABS: 15.0000 mg | ORAL_TABLET | Freq: Every day | ORAL | 0 refills | Status: DC

## 2017-09-04 MED ORDER — FERROUS SULFATE 325 (65 FE) MG PO TABS: 325.0000 mg | ORAL_TABLET | Freq: Three times a day (TID) | ORAL | 0 refills | Status: DC

## 2017-09-04 MED ORDER — SENNOSIDES-DOCUSATE SODIUM 8.6-50 MG PO TABS: 1.0000 | ORAL_TABLET | Freq: Two times a day (BID) | ORAL | 0 refills | Status: DC

## 2017-09-04 MED ORDER — RISPERIDONE 2 MG PO TABS: 2.0000 mg | ORAL_TABLET | Freq: Every day | ORAL | 0 refills | Status: DC

## 2017-09-04 MED ORDER — HYDROXYZINE HCL 25 MG PO TABS: 25.0000 mg | ORAL_TABLET | Freq: Four times a day (QID) | ORAL | 0 refills | Status: DC | PRN

## 2017-09-04 NOTE — Discharge Summary (Addendum)
Physician Discharge Summary Note  Patient:  Kelsey Gray is an 42 y.o., female MRN:  [id number] DOB:  [birth year] Patient phone:  [phone] (home)  Patient address:   [address] [place] [address],  Total Time spent with patient: Greater than 30 minutes  Date of Admission:  08/28/2017  Date of Discharge: 09-04-17  Reason for Admission: States she has been feeling "overworked & overwhelmed" due to limited social support and limited resources .  Principal Problem: Schizophrenia [facility])  Discharge Diagnoses: Patient Active Problem List   Diagnosis Date Noted  . Microcytic anemia [D50.9] 08/27/2017  . Schizophrenia (HCC) [F20.9] 08/27/2017   Past Psychiatric History: Schizophrenia.  Past Medical History:  Past Medical History:  Diagnosis Date  . Medical history non-contributory    History reviewed. No pertinent surgical history.  Family History: History reviewed. No pertinent family history.  Family Psychiatric  History: See H&P  Social History:  Social History   Substance and Sexual Activity  Alcohol Use Not Currently     Social History   Substance and Sexual Activity  Drug Use Never    Social History   Socioeconomic History  . Marital status: Single    Spouse name: Not on file  . Number of children: Not on file  . Years of education: Not on file  . Highest education level: Not on file  Occupational History  . Not on file  Social Needs  . Financial resource strain: Not on file  . Food insecurity:    Worry: Not on file    Inability: Not on file  . Transportation needs:    Medical: Not on file    Non-medical: Not on file  Tobacco Use  . Smoking status: Never Smoker  . Smokeless tobacco: Never Used  Substance and Sexual Activity  . Alcohol use: Not Currently  . Drug use: Never  . Sexual activity: Not on file  Lifestyle  . Physical activity:    Days per week: Not on file    Minutes per session: Not on file  . Stress: Not on file   Relationships  . Social connections:    Talks on phone: Not on file    Gets together: Not on file    Attends religious service: Not on file    Active member of club or organization: Not on file    Attends meetings of clubs or organizations: Not on file    Relationship status: Not on file  Other Topics Concern  . Not on file  Social History Narrative  . Not on file   Hospital Course: (As per intake SRA): 42 year old female, divorced, has 39 year old daughter, unemployed . Lives in [place] Patient presents as fair historian, states she has been feeling " overworked" and describes feeling overwhelmed due to limited social support and limited resources . States she went to her mother's for help/assistance but that it did not go well . Reports she decided to come to [facility] and presented to ED Initially reporting physical symptoms ( sore throat, rectal bleeding). Patient presented guarded, paranoid,and as per ED notes, sister and mother provided collateral information reporting that patient has been paranoid, feeling she is being followed, convinced her phone is being tapped, attempting to communicate in code with her sister, not eating with concerns food is poisoned, not sleeping. Currently patient presents guarded, fearful, states that unit soap is making her feel sick and expressing concerns about the ice in her water. She denies depression, but states  she has been overwhelmed. Reports she has not been eating well because " I don't have a lot of money". Denies suicidal ideations. States her child is now in the custody of the child's father. Denies prior psychiatric admissions , denies history of depression, mania or of psychosis. Denies history of suicide attempts.  Was not taking medications prior to admission.  Today upon her discharge evaluation: Pt shares, "I'm pretty good." Ptdenies any specific concerns. She is sleeping well. Her appetite is good. She denies SI/HI/AH/VH. She is  tolerating her medications well. She agrees to continue her current regimen without changes. Pt denies paranoia or delusional content. She denies feeling concerned that the food is poisoned, sharing, "I haven't really been thinking about it." She was able to engage in safety planning including [facility] or contact emergency services if she feels unable to maintain her own safety or the safety of others. Pt had no further questions, comments, or concerns.  Plan Of Care/Follow-up recommendations:   -Discharge to outpatient level of care  -Schizophrenia -Continue risperdal 2mg  po qhs -Continue remeron 15mg  po qhs  -EPS -Continue cogentin 1mg  po/IM q12h prn EPS  -microcytic anemia -Continue ferrous sulfate 325mg  po TID with meals  -anxiety -Continue vistaril 25mg  po q6h prn anxiety  Activity:  as tolerated Diet:  normal Tests:  NA Other:  see above for DC plan   Physical Findings: AIMS:  , ,  ,  ,    CIWA:    COWS:     Musculoskeletal: Strength & Muscle Tone: within normal limits Gait & Station: normal Patient leans: N/A  Psychiatric Specialty Exam: Physical Exam  Constitutional: She appears well-developed.  HENT:  Head: Normocephalic.  Eyes: Pupils are equal, round, and reactive to light.  Neck: Normal range of motion.  Cardiovascular: Normal rate.  Respiratory: Effort normal.  GI: Soft.  Genitourinary:  Genitourinary Comments: Deferred  Musculoskeletal: Normal range of motion.  Neurological: She is alert.  Skin: Skin is warm.    Review of Systems  Constitutional: Negative.   HENT: Negative.   Eyes: Negative.   Respiratory: Negative.   Cardiovascular: Negative.   Gastrointestinal: Negative.   Genitourinary: Negative.   Musculoskeletal: Negative.   Skin: Negative.   Neurological: Negative.   Endo/Heme/Allergies: Negative.   Psychiatric/Behavioral: Positive for depression  (Stable) and hallucinations (Hx. psychosis (stable)). Negative for memory loss, substance abuse and suicidal ideas. The patient has insomnia (Stable). The patient is not nervous/anxious.     Blood pressure 104/88, pulse 81, temperature 98.3 F (36.8 C), temperature source Oral, resp. rate 16, height 5\' 4"  (1.626 m), SpO2 100 %.There is no height or weight on file to calculate BMI.  See H&P   Have you used any form of tobacco in the last 30 days? (Cigarettes, Smokeless Tobacco, Cigars, and/or Pipes): No  Has this patient used any form of tobacco in the last 30 days? (Cigarettes, Smokeless Tobacco, Cigars, and/or Pipes): N/A  Blood Alcohol level:  Lab Results  Component Value Date   ETH <10 08/26/2017   Metabolic Disorder Labs:  Lab Results  Component Value Date   HGBA1C 5.2 08/30/2017   MPG 102.54 08/30/2017   Lab Results  Component Value Date   PROLACTIN 90.0 (H) 08/30/2017   Lab Results  Component Value Date   CHOL 181 08/30/2017   TRIG 73 08/30/2017   HDL 65 08/30/2017   CHOLHDL 2.8 08/30/2017   VLDL 15 08/30/2017   LDLCALC 101 (H) 08/30/2017   See  Psychiatric Specialty Exam and Suicide Risk Assessment completed by Attending Physician prior to discharge.  Discharge destination:  Home  Is patient on multiple antipsychotic therapies at discharge:  No   Has Patient had three or more failed trials of antipsychotic monotherapy by history:  No  Recommended Plan for Multiple Antipsychotic Therapies: NA  Allergies as of 09/04/2017   No Known Allergies     Medication List    TAKE these medications     Indication  ferrous sulfate 325 (65 FE) MG tablet Take 1 tablet (325 mg total) by mouth 3 (three) times daily with meals. For anemia  Indication:  Anemia From Inadequate Iron in the Body   hydrOXYzine 25 MG tablet Commonly known as:  ATARAX/VISTARIL Take 1 tablet (25 mg total) by mouth every 6 (six) hours as needed for anxiety.  Indication:  Feeling Anxious    mirtazapine 15 MG tablet Commonly known as:  REMERON Take 1 tablet (15 mg total) by mouth at bedtime. For depression/sleep  Indication:  Major Depressive Disorder, Insomnia   risperiDONE 2 MG tablet Commonly known as:  RISPERDAL Take 1 tablet (2 mg total) by mouth at bedtime. For mood control  Indication:  Mood control   senna-docusate 8.6-50 MG tablet Commonly known as:  Senokot-S Take 1 tablet by mouth 2 (two) times daily. (May buy from over the counter): For constipation  Indication:  Constipation      Follow-up Information    [facility] on 09/08/2017.   Why:  Tuesday at 12:40 for a 1:00 appointment with Dr Hortencia Pilar information: [address] [place] [address] [phone]          Follow-up recommendations: Activity:  As tolerated Diet: As recommended by your primary care doctor. Keep all scheduled follow-up appointments as recommended.   Comments: Patient is instructed prior to discharge to: Take all medications as prescribed by his/her mental healthcare provider. Report any adverse effects and or reactions from the medicines to his/her outpatient provider promptly. Patient has been instructed & cautioned: To not engage in alcohol and or illegal drug use while on prescription medicines. In the event of worsening symptoms, patient is instructed to call the crisis hotline, 911 and or go to the nearest ED for appropriate evaluation and treatment of symptoms. To follow-up with his/her primary care provider for your other medical issues, concerns and or health care needs.   Signed: Armandina Stammer, NP, PMHNP, FNP-BC 09/04/2017, 9:52 AM   Patient seen, Suicide Assessment Completed.  Disposition Plan Reviewed

## 2017-09-04 NOTE — Progress Notes (Signed)
D:  Patient's self inventory sheet, patient sleeps good, no sleep medication given.  Good appetite, normal energy level, good concentration.  Denied depression, hopeless and anxiety.  Denied withdrawals.  Denied SI.  Denied physical problems.  Denied physical pain.  Goal is getting final take away from group.  Plans to attend and participate in all groups.  Does have discharge plans.  A:  Medications administered per MD orders.  Encouragement and emotional support given patient. R:  Denied SI and HI, contracts for safety.  Denied A/V hallucinations.  Safety maintained with 15 minute checks. Patient looking forward to discharge.

## 2017-09-04 NOTE — Plan of Care (Signed)
Patient was active in her treatment through recreation therapy by attending ans speaking in all groups.   Kelsey Gray, LRT/CTRS

## 2017-09-04 NOTE — Progress Notes (Signed)
Recreation Therapy Notes  INPATIENT RECREATION TR PLAN  Patient Details Name: Kelsey Gray MRN: [id number] DOB: [birth year] Today's Date: 09/04/2017  Rec Therapy Plan Is patient appropriate for Therapeutic Recreation?: Yes Treatment times per week: 3 days per week  Estimated Length of Stay: 5-7 TR Treatment/Interventions: Group participation (Comment)  Discharge Criteria Pt will be discharged from therapy if:: Discharged Treatment plan/goals/alternatives discussed and agreed upon by:: Patient/family  Discharge Summary Short term goals set: See patient care plan Short term goals met: Complete Progress toward goals comments: Groups attended Which groups?: Self-esteem, Wellness, Stress management, Coping skills Reason goals not met: none Therapeutic equipment acquired: N/A Reason patient discharged from therapy: Discharge from hospital Pt/family agrees with progress & goals achieved: Yes Date patient discharged from therapy: 09/04/17  Delos Haring, LRT/CTRS  [place] 09/04/2017, 10:35 AM

## 2017-09-04 NOTE — Progress Notes (Signed)
Discharge Note:  Patient discharged home with family member.  Patient denied SI and HI.  Denied A/V hallucinations.  Suicide prevention information given and discussed with patient who stated she understood and had no questions.  Patient stated she received all her belongings, clothing, toiletries, misc items, prescriptions, etc.  Patient stated she appreciated all assistance received from [facility] staff.  All required discharge information given to patient at discharge  

## 2017-09-04 NOTE — Progress Notes (Signed)
  [facility] Discharge [facility] :  Will you be returning to the same living situation after discharge:  Yes,  Home At discharge, do you have transportation home?: Yes,  Mom Do you have the ability to pay for your medications: Yes,  Insurance  Release of information consent forms completed and in the chart;  Patient's signature needed at discharge.  Patient to Follow up at: Follow-up Information    [facility] Follow up on 09/08/2017.   Why:  Tuesday at 12:40 for a 1:00 appointment with Dr Hortencia PilarA Contact information: [address] [place] [id number] [phone]           Next level of care provider has access to [facility] Link:no  Safety Planning and Suicide Prevention discussed: Yes,  Yes  Have you used any form of tobacco in the last 30 days? (Cigarettes, Smokeless Tobacco, Cigars, and/or Pipes): No  Has patient been referred to the Quitline?: N/A patient is not a smoker  Patient has been referred for addiction treatment: N/A  Ida RogueRodney B Tanvi Gatling, LCSW 09/04/2017, 8:55 AM

## 2017-09-04 NOTE — [facility] Suicide Risk Assessment (Signed)
[facility]   Principal Problem: Schizophrenia [facility](HCC) Discharge Diagnoses:  Patient Active Problem List   Diagnosis Date Noted  . Microcytic anemia [D50.9] 08/27/2017  . Schizophrenia (HCC) [F20.9] 08/27/2017    Total Time spent with patient: 30 minutes  Musculoskeletal: Strength & Muscle Tone: within normal limits Gait & Station: normal Patient leans: N/A  Psychiatric Specialty Exam: Review of Systems  Constitutional: Negative for chills and fever.  Respiratory: Negative for cough and shortness of breath.   Cardiovascular: Negative for chest pain.  Gastrointestinal: Negative for abdominal pain, heartburn, nausea and vomiting.  Psychiatric/Behavioral: Negative for depression, hallucinations and suicidal ideas. The patient is not nervous/anxious and does not have insomnia.     Blood pressure 104/88, pulse 81, temperature 98.3 F (36.8 C), temperature source Oral, resp. rate 16, height 5\' 4"  (1.626 m), SpO2 100 %.There is no height or weight on file to calculate BMI.  General Appearance: Casual and Fairly Groomed  Patent attorneyye Contact::  Good  Speech:  Clear and Coherent and Normal Rate  Volume:  Normal  Mood:  Euthymic  Affect:  Appropriate, Congruent and Flat  Thought Process:  Coherent and Goal Directed  Orientation:  Full (Time, Place, and Person)  Thought Content:  Logical  Suicidal Thoughts:  No  Homicidal Thoughts:  No  Memory:  Immediate;   Fair Recent;   Fair Remote;   Fair  Judgement:  Fair  Insight:  Shallow  Psychomotor Activity:  Normal  Concentration:  Fair  Recall:  FiservFair  Fund of Knowledge:Fair  Language: Fair  Akathisia:  No  Handed:    AIMS (if indicated):     Assets:  Resilience Social Support  Sleep:  Number of Hours: 6.5  Cognition: WNL  ADL's:  Intact   Mental Status Per Nursing Assessment::   On Admission:  NA  Demographic Factors:  Low socioeconomic status  Loss Factors: Financial problems/change in socioeconomic  status  Historical Factors: Impulsivity  Risk Reduction Factors:   Sense of responsibility to family, Living with another person, especially a relative, Positive social support, Positive therapeutic relationship and Positive coping skills or problem solving skills  Continued Clinical Symptoms:  Schizophrenia:   Paranoid or undifferentiated type  Cognitive Features That Contribute To Risk:  None    Suicide Risk:  Minimal: No identifiable suicidal ideation.  Patients presenting with no risk factors but with morbid ruminations; may be classified as minimal risk based on the severity of the depressive symptoms  Follow-up Information    [facility] on 09/08/2017.   Why:  Tuesday at 12:40 for a 1:00 appointment with Dr Hortencia PilarA Contact information: [address] [place] [address] [phone]         Subjective Data:  As per intake SRA: 42 year old female, divorced, has 144 year old daughter, unemployed . Lives in [place] Patient presents as fair historian, states she has been feeling " overworked" and describes feeling overwhelmed due to limited social support and limited resources . States she went to her mother's for help/assistance but that it did not go well . [facility] and [facility] Initially reporting physical symptoms ( sore throat, rectal bleeding). Patient presented guarded, paranoid,and as per ED notes, sister and mother provided collateral information reporting that patient has been paranoid, feeling she is being followed, convinced her phone is being tapped, attempting to communicate in code with her sister, not eating with concerns food is  poisoned, not sleeping. Currently patient presents guarded, fearful, states that unit soap is making her feel sick and expressing concerns about the ice in her water . She denies depression, but states she has been overwhelmed. Reports she has not been eating well  because " I don't have a lot of money". Denies suicidal ideations. States her child is now in the custody of the child's father. Denies prior psychiatric admissions , denies history of depression, mania or of psychosis. Denies history of suicide attempts.  Was not taking medications prior to admission.  Today upon evaluation: Pt shares, "I'm pretty good." Pt denies any specific concerns. She is sleeping well. Her appetite is good. She denies SI/HI/AH/VH. She is tolerating her medications well. She agrees to continue her current regimen without changes. Pt denies paranoia or delusional content. She denies feeling concerned that the food is poisoned, sharing, "I haven't really been thinking about it." She was able to engage in safety planning including plan to return to [facility] or contact emergency services if she feels unable to maintain her own safety or the safety of others. Pt had no further questions, comments, or concerns.    Plan Of Care/Follow-up recommendations:   -Discharge to outpatient level of care  -Schizophrenia -Continue risperdal 2mg  po qhs -Continue remeron 15mg  po qhs  -EPS -Continue cogentin 1mg  po/IM q12h prn EPS  -microcytic anemia -Continue ferrous sulfate 325mg  po TID with meals  -anxiety -Continue vistaril 25mg  po q6h prn anxiety  Activity:  as tolerated Diet:  normal Tests:  NA Other:  see above for DC plan  Micheal Likens, MD 09/04/2017, 9:32 AM

## 2017-09-04 NOTE — Progress Notes (Signed)
Patient ID: Kelsey BarkJade Gray, female   DOB: April 06, 1975, 42 y.o.   MRN: [id number]  DAR Note: Pt remained isolated to her room all evening. "I just need all the time I can get to think." Pt complained of mild back pain; "pain is at a 2 right now." Pt denied anxiety, depression, AVH or HI/SI. Pt was med compliant. All patient's questions and concerns addressed. Support, encouragement, and safe environment provided. 15-minute safety checks continue.  Pt did not attend wrap-up group.
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
## 2021-05-24 ENCOUNTER — Emergency Department (HOSPITAL_COMMUNITY)
Admission: EM | Admit: 2021-05-24 | Discharge: 2021-05-30 | Disposition: A | Discharge status: Home / Self Care | Payer: BC Managed Care – PPO | Attending: Emergency Medicine | Admitting: Emergency Medicine

## 2021-05-24 ENCOUNTER — Other Ambulatory Visit: Payer: Self-pay

## 2021-05-24 ENCOUNTER — Encounter (HOSPITAL_COMMUNITY): Payer: Self-pay

## 2021-05-24 DIAGNOSIS — Z043 Encounter for examination and observation following other accident: Secondary | ICD-10-CM | POA: Insufficient documentation

## 2021-05-24 DIAGNOSIS — R443 Hallucinations, unspecified: Secondary | ICD-10-CM

## 2021-05-24 DIAGNOSIS — F250 Schizoaffective disorder, bipolar type: Secondary | ICD-10-CM

## 2021-05-24 DIAGNOSIS — R7989 Other specified abnormal findings of blood chemistry: Secondary | ICD-10-CM | POA: Insufficient documentation

## 2021-05-24 DIAGNOSIS — F259 Schizoaffective disorder, unspecified: Secondary | ICD-10-CM

## 2021-05-24 DIAGNOSIS — Z20822 Contact with and (suspected) exposure to covid-19: Secondary | ICD-10-CM | POA: Insufficient documentation

## 2021-05-24 DIAGNOSIS — R440 Auditory hallucinations: Secondary | ICD-10-CM | POA: Diagnosis present

## 2021-05-24 DIAGNOSIS — F209 Schizophrenia, unspecified: Secondary | ICD-10-CM

## 2021-05-24 LAB — CBC WITH DIFFERENTIAL/PLATELET
Abs Immature Granulocytes: 0.03 10*3/uL (ref 0.00–0.07)
Basophils Absolute: 0 10*3/uL (ref 0.0–0.1)
Basophils Relative: 1 %
Eosinophils Absolute: 0 10*3/uL (ref 0.0–0.5)
Eosinophils Relative: 0 %
HCT: 37 % (ref 36.0–46.0)
Hemoglobin: 11 g/dL — ABNORMAL LOW (ref 12.0–15.0)
Immature Granulocytes: 0 %
Lymphocytes Relative: 15 %
Lymphs Abs: 1.2 10*3/uL (ref 0.7–4.0)
MCH: 23.4 pg — ABNORMAL LOW (ref 26.0–34.0)
MCHC: 29.7 g/dL — ABNORMAL LOW (ref 30.0–36.0)
MCV: 78.7 fL — ABNORMAL LOW (ref 80.0–100.0)
Monocytes Absolute: 0.6 10*3/uL (ref 0.1–1.0)
Monocytes Relative: 7 %
Neutro Abs: 5.9 10*3/uL (ref 1.7–7.7)
Neutrophils Relative %: 77 %
Platelets: 210 10*3/uL (ref 150–400)
RBC: 4.7 MIL/uL (ref 3.87–5.11)
RDW: 25.9 % — ABNORMAL HIGH (ref 11.5–15.5)
WBC: 7.7 10*3/uL (ref 4.0–10.5)
nRBC: 0 % (ref 0.0–0.2)

## 2021-05-24 LAB — RESP PANEL BY RT-PCR (FLU A&B, COVID) ARPGX2
Influenza A by PCR: NEGATIVE
Influenza B by PCR: NEGATIVE
SARS Coronavirus 2 by RT PCR: NEGATIVE

## 2021-05-24 LAB — RAPID URINE DRUG SCREEN, HOSP PERFORMED
Amphetamines: NOT DETECTED
Barbiturates: NOT DETECTED
Benzodiazepines: NOT DETECTED
Cocaine: NOT DETECTED
Opiates: NOT DETECTED
Tetrahydrocannabinol: NOT DETECTED

## 2021-05-24 LAB — COMPREHENSIVE METABOLIC PANEL
ALT: 35 U/L (ref 0–44)
AST: 29 U/L (ref 15–41)
Albumin: 4.3 g/dL (ref 3.5–5.0)
Alkaline Phosphatase: 40 U/L (ref 38–126)
Anion gap: 12 (ref 5–15)
BUN: 26 mg/dL — ABNORMAL HIGH (ref 6–20)
CO2: 23 mmol/L (ref 22–32)
Calcium: 9.9 mg/dL (ref 8.9–10.3)
Chloride: 100 mmol/L (ref 98–111)
Creatinine, Ser: 0.72 mg/dL (ref 0.44–1.00)
GFR, Estimated: >60 mL/min (ref >60)
Glucose, Bld: 113 mg/dL — ABNORMAL HIGH (ref 70–99)
Potassium: 3.5 mmol/L (ref 3.5–5.1)
Sodium: 135 mmol/L (ref 135–145)
Total Bilirubin: 0.8 mg/dL (ref 0.3–1.2)
Total Protein: 7.8 g/dL (ref 6.5–8.1)

## 2021-05-24 LAB — I-STAT BETA HCG BLOOD, ED (MC, WL, AP ONLY): I-stat hCG, quantitative: <5 m[IU]/mL (ref <5)

## 2021-05-24 LAB — ETHANOL: Alcohol, Ethyl (B): <10 mg/dL (ref <10)

## 2021-05-24 MED ORDER — TRAMADOL HCL 50 MG PO TABS: 50.0000 mg | ORAL_TABLET | Freq: Four times a day (QID) | ORAL | Status: DC | PRN

## 2021-05-24 MED ORDER — STERILE WATER FOR INJECTION IJ SOLN
INTRAMUSCULAR | Status: AC
  Administered 2021-05-24: 1.2 mL
  Filled 2021-05-24: qty 10

## 2021-05-24 MED ORDER — RISPERIDONE 0.5 MG PO TABS
0.2500 mg | ORAL_TABLET | Freq: Every day | ORAL | Status: DC
  Administered 2021-05-25 – 2021-05-27 (×3): 0.25 mg via ORAL
  Filled 2021-05-24 (×3): qty 1

## 2021-05-24 MED ORDER — ZIPRASIDONE MESYLATE 20 MG IM SOLR
20.0000 mg | Freq: Once | INTRAMUSCULAR | Status: AC
Start: 2021-05-24 — End: 2021-05-24
  Administered 2021-05-24: 20 mg via INTRAMUSCULAR
  Filled 2021-05-24: qty 20

## 2021-05-24 MED ORDER — RISPERIDONE 1 MG PO TABS
1.5000 mg | ORAL_TABLET | Freq: Two times a day (BID) | ORAL | Status: DC
  Administered 2021-05-24 – 2021-05-28 (×9): 1.5 mg via ORAL
  Filled 2021-05-24 (×9): qty 1

## 2021-05-24 MED ORDER — RISPERIDONE 1 MG PO TABS: 1.5000 mg | ORAL_TABLET | Freq: Two times a day (BID) | ORAL | Status: DC

## 2021-05-24 NOTE — Consult Note (Signed)
[facility] Face-to-Face Psychiatry Consult   Reason for Consult:  psych. IVC Referring Physician:  Tanda Rockers, PA-C Patient Identification: Kelsey Gray MRN:  [id number] Principal Diagnosis: Schizoaffective disorder [facility]) Diagnosis:  Principal Problem:   Schizoaffective disorder (HCC)   Total Time spent with patient: 20 minutes  Subjective:   Kelsey Gray is a 46 y.o. female patient admitted with hallucinations. On assessment she presents disheveled in appearance, calm and cooperative. States she was admitted for "spiritual warfare". She is religiously preoccupied stating she is having "thoughts from the demonic realm". She endorses paranoid thoughts and auditory hallucinations. . She denies any suicidal or homicidal ideations, visual hallucinations. Staff is reporting patient comes to the door and yells religious statements periodically.   Per EDRN note 05/24/21 0448:"Pt is repeatedly screaming intermittently. Pt expresses this is her way of"praying"  HPI:  [name] is a 46 year old female patient with past psychiatric history of schizophrenia, bipolar disorder, schizoaffective disorder who presented to [facility] via EMS for hallucinations where she was noted to be screaming. Of note patient presented to ED 05/13/21 after jumping off balcony and breaking her left ankle. UDS-, BAL<10. PDMP reviewed, no prescriptions noted.   Past Psychiatric History: schizophrenia, bipolar disorder, schizoaffective disorder  Risk to Self:   Risk to Others:   Prior Inpatient Therapy:   Prior Outpatient Therapy:    Past Medical History:  Past Medical History:  Diagnosis Date   Medical history non-contributory    History reviewed. No pertinent surgical history. Family History: History reviewed. No pertinent family history. Family Psychiatric  History: not noted Social History:  Social History   Substance and Sexual Activity  Alcohol Use Not Currently     Social History   Substance and Sexual Activity   Drug Use Never    Social History   Socioeconomic History   Marital status: Single    Spouse name: Not on file   Number of children: Not on file   Years of education: Not on file   Highest education level: Not on file  Occupational History   Not on file  Tobacco Use   Smoking status: Never   Smokeless tobacco: Never  Vaping Use   Vaping Use: Never used  Substance and Sexual Activity   Alcohol use: Not Currently   Drug use: Never   Sexual activity: Not on file  Other Topics Concern   Not on file  Social History Narrative   Not on file   Social Determinants of Health   Financial Resource Strain: Not on file  Food Insecurity: Not on file  Transportation Needs: Not on file  Physical Activity: Not on file  Stress: Not on file  Social Connections: Not on file   Additional Social History:   Allergies:  No Known Allergies  Labs:  Results for orders placed or performed during the hospital encounter of 05/24/21 (from the past 48 hour(s))  Resp Panel by RT-PCR (Flu A&B, Covid) Nasopharyngeal Swab     Status: None   Collection Time: 05/24/21  4:30 AM   Specimen: Nasopharyngeal Swab; Nasopharyngeal(NP) swabs in vial transport medium  Result Value Ref Range   SARS Coronavirus 2 by RT PCR NEGATIVE NEGATIVE    Comment: (NOTE) SARS-CoV-2 target nucleic acids are NOT DETECTED.  The SARS-CoV-2 RNA is generally detectable in upper respiratory specimens during the acute phase of infection. The lowest concentration of SARS-CoV-2 viral copies this assay can detect is 138 copies/mL. A negative result does not preclude SARS-Cov-2 infection and should not  be used as the sole basis for treatment or other patient management decisions. A negative result may occur with  improper specimen collection/handling, submission of specimen other than nasopharyngeal swab, presence of viral mutation(s) within the areas targeted by this assay, and inadequate number of viral copies(<138 copies/mL). A  negative result must be combined with clinical observations, patient history, and epidemiological information. The expected result is Negative.  Fact Sheet for Patients:  BloggerCourse.com  Fact Sheet for Healthcare Providers:  SeriousBroker.it  This test is no t yet approved or cleared by the [place] FDA and  has been authorized for detection and/or diagnosis of SARS-CoV-2 by FDA under an Emergency Use Authorization (EUA). This EUA will remain  in effect (meaning this test can be used) for the duration of the COVID-19 declaration under Section 564(b)(1) of the Act, 21 U.S.C.section 360bbb-3(b)(1), unless the authorization is terminated  or revoked sooner.       Influenza A by PCR NEGATIVE NEGATIVE   Influenza B by PCR NEGATIVE NEGATIVE    Comment: (NOTE) The Xpert Xpress SARS-CoV-2/FLU/RSV plus assay is intended as an aid in the diagnosis of influenza from Nasopharyngeal swab specimens and should not be used as a sole basis for treatment. Nasal washings and aspirates are unacceptable for Xpert Xpress SARS-CoV-2/FLU/RSV testing.  Fact Sheet for Patients: BloggerCourse.com  Fact Sheet for Healthcare Providers: SeriousBroker.it  This test is not yet approved or cleared by the [place] FDA and has been authorized for detection and/or diagnosis of SARS-CoV-2 by FDA under an Emergency Use Authorization (EUA). This EUA will remain in effect (meaning this test can be used) for the duration of the COVID-19 declaration under Section 564(b)(1) of the Act, 21 U.S.C. section 360bbb-3(b)(1), unless the authorization is terminated or revoked.  Performed at [facility], [address], [place], [place] [address]   Comprehensive metabolic panel     Status: Abnormal   Collection Time: 05/24/21  4:30 AM  Result Value Ref Range   Sodium 135 135 - 145 mmol/L    Potassium 3.5 3.5 - 5.1 mmol/L   Chloride 100 98 - 111 mmol/L   CO2 23 22 - 32 mmol/L   Glucose, Bld 113 (H) 70 - 99 mg/dL    Comment: Glucose reference range applies only to samples taken after fasting for at least 8 hours.   BUN 26 (H) 6 - 20 mg/dL   Creatinine, Ser 1.95 0.44 - 1.00 mg/dL   Calcium 9.9 8.9 - 09.3 mg/dL   Total Protein 7.8 6.5 - 8.1 g/dL   Albumin 4.3 3.5 - 5.0 g/dL   AST 29 15 - 41 U/L   ALT 35 0 - 44 U/L   Alkaline Phosphatase 40 38 - 126 U/L   Total Bilirubin 0.8 0.3 - 1.2 mg/dL   GFR, Estimated >26 >71 mL/min    Comment: (NOTE) Calculated using the CKD-EPI Creatinine Equation (2021)    Anion gap 12 5 - 15    Comment: Performed at [facility], [address], [place], [place] [address]  Ethanol     Status: None   Collection Time: 05/24/21  4:30 AM  Result Value Ref Range   Alcohol, Ethyl (B) <10 <10 mg/dL    Comment: (NOTE) Lowest detectable limit for serum alcohol is 10 mg/dL.  For medical purposes only. Performed at [facility], [address] [place], [place] [address]   Urine rapid drug screen (hosp performed)     Status: None  Collection Time: 05/24/21  4:30 AM  Result Value Ref Range   Opiates NONE DETECTED NONE DETECTED   Cocaine NONE DETECTED NONE DETECTED   Benzodiazepines NONE DETECTED NONE DETECTED   Amphetamines NONE DETECTED NONE DETECTED   Tetrahydrocannabinol NONE DETECTED NONE DETECTED   Barbiturates NONE DETECTED NONE DETECTED    Comment: (NOTE) DRUG SCREEN FOR MEDICAL PURPOSES ONLY.  IF CONFIRMATION IS NEEDED FOR ANY PURPOSE, NOTIFY LAB WITHIN 5 DAYS.  LOWEST DETECTABLE LIMITS FOR URINE DRUG SCREEN Drug Class                     Cutoff (ng/mL) Amphetamine and metabolites    1000 Barbiturate and metabolites    200 Benzodiazepine                 200 Tricyclics and metabolites     300 Opiates and metabolites        300 Cocaine and metabolites        300 THC                             50 Performed at [facility], [address] [place], [place] [address]   CBC with Diff     Status: Abnormal   Collection Time: 05/24/21  4:30 AM  Result Value Ref Range   WBC 7.7 4.0 - 10.5 K/uL   RBC 4.70 3.87 - 5.11 MIL/uL   Hemoglobin 11.0 (L) 12.0 - 15.0 g/dL   HCT 28.3 66.2 - 94.7 %   MCV 78.7 (L) 80.0 - 100.0 fL   MCH 23.4 (L) 26.0 - 34.0 pg   MCHC 29.7 (L) 30.0 - 36.0 g/dL   RDW 65.4 (H) 65.0 - 35.4 %   Platelets 210 150 - 400 K/uL    Comment: REPEATED TO VERIFY   nRBC 0.0 0.0 - 0.2 %   Neutrophils Relative % 77 %   Neutro Abs 5.9 1.7 - 7.7 K/uL   Lymphocytes Relative 15 %   Lymphs Abs 1.2 0.7 - 4.0 K/uL   Monocytes Relative 7 %   Monocytes Absolute 0.6 0.1 - 1.0 K/uL   Eosinophils Relative 0 %   Eosinophils Absolute 0.0 0.0 - 0.5 K/uL   Basophils Relative 1 %   Basophils Absolute 0.0 0.0 - 0.1 K/uL   Immature Granulocytes 0 %   Abs Immature Granulocytes 0.03 0.00 - 0.07 K/uL   Schistocytes PRESENT    Tear Drop Cells PRESENT    Polychromasia PRESENT    Ovalocytes PRESENT     Comment: Performed at [facility], [address], [place], [place] [address]  I-Stat beta hCG blood, ED     Status: None   Collection Time: 05/24/21  5:22 AM  Result Value Ref Range   I-stat hCG, quantitative <5.0 <5 mIU/mL   Comment 3            Comment:   GEST. AGE      CONC.  (mIU/mL)   <=1 WEEK        5 - 50     2 WEEKS       50 - 500     3 WEEKS       100 - 10,000     4 WEEKS     1,000 - 30,000        FEMALE AND NON-PREGNANT FEMALE:     LESS THAN 5 mIU/mL  Current Facility-Administered Medications  Medication Dose Route Frequency Provider Last Rate Last Admin   risperiDONE (RISPERDAL) tablet 0.25 mg  0.25 mg Oral QHS Leevy-Johnson, Ulonda Klosowski A, NP       risperiDONE (RISPERDAL) tablet 1.5 mg  1.5 mg Oral BID Leevy-Johnson, Yavonne Kiss A, NP   1.5 mg at 05/24/21 1005   Current Outpatient Medications  Medication Sig Dispense Refill   aspirin EC  81 MG tablet Take 81 mg by mouth 2 (two) times daily. Swallow whole.     risperiDONE (RISPERDAL) 0.25 MG tablet Take 0.25 mg by mouth at bedtime.     traMADol (ULTRAM) 50 MG tablet Take 50 mg by mouth every 6 (six) hours as needed for moderate pain.     risperiDONE (RISPERDAL) 0.5 MG tablet Take 1.5 mg by mouth 2 (two) times daily. (Patient not taking: Reported on 05/24/2021)      Musculoskeletal: Strength & Muscle Tone: within normal limits Gait & Station: normal Patient leans: N/A  Psychiatric Specialty Exam:  Presentation  General Appearance: Casual; Disheveled  Eye Contact:Fair  Speech:Clear and Coherent  Speech Volume:Normal  Handedness:No data recorded  Mood and Affect  Mood:Euthymic  Affect:Labile   Thought Process  Thought Processes:Goal Directed  Descriptions of Associations:Tangential  Orientation:Partial  Thought Content:Delusions; Tangential; Perseveration  History of Schizophrenia/Schizoaffective disorder:Yes  Duration of Psychotic Symptoms:Greater than six months  Hallucinations:Hallucinations: Auditory  Ideas of Reference:Paranoia; Percusatory  Suicidal Thoughts:Suicidal Thoughts: No  Homicidal Thoughts:Homicidal Thoughts: No   Sensorium  Memory:Immediate Fair; Recent Good; Remote Fair  Judgment:Impaired  Insight:Shallow; Lacking  Executive Functions  Concentration:Fair  Attention Span:Fair  Recall:Fair  Fund of Knowledge:Fair  Language:Fair  Psychomotor Activity  Psychomotor Activity:Psychomotor Activity: Normal  Assets  Assets:Physical Health; Social Support; Resilience; Housing  Sleep  Sleep:Sleep: Fair  Physical Exam: Physical Exam Vitals and nursing note reviewed.  Constitutional:      Appearance: She is normal weight.  HENT:     Head: Normocephalic.     Nose: Nose normal.     Mouth/Throat:     Mouth: Mucous membranes are moist.     Pharynx: Oropharynx is clear.  Eyes:     Pupils: Pupils are equal, round,  and reactive to light.  Cardiovascular:     Rate and Rhythm: Normal rate.     Pulses: Normal pulses.  Pulmonary:     Effort: Pulmonary effort is normal.  Abdominal:     Palpations: Abdomen is soft.  Musculoskeletal:        General: Normal range of motion.     Cervical back: Normal range of motion.  Skin:    General: Skin is warm and dry.  Neurological:     Mental Status: She is alert. Mental status is at baseline.  Psychiatric:        Attention and Perception: She perceives auditory hallucinations.        Mood and Affect: Mood and affect normal.        Speech: Speech is tangential.        Behavior: Behavior is cooperative.        Thought Content: Thought content is paranoid and delusional.        Cognition and Memory: Memory is impaired.        Judgment: Judgment is inappropriate.   Review of Systems  Psychiatric/Behavioral:  Positive for hallucinations.   All other systems reviewed and are negative. Blood pressure 117/73, pulse 98, temperature 98.6 F (37 C), temperature source Oral, resp. rate 18, SpO2 97 %. There  is no height or weight on file to calculate BMI.  Treatment Plan Summary: Daily contact with patient to assess and evaluate symptoms and progress in treatment, Medication management, and Plan seek inpatient psychiatric hospitalization for further observation, stabilization, and treatment.   Disposition: Recommend psychiatric Inpatient admission when medically cleared. Supportive therapy provided about ongoing stressors. Discussed crisis plan, support from social network, calling 911, coming to the Emergency Department, and calling Suicide Hotline.  Loletta Parish, NP 05/24/2021 4:38 PM

## 2021-05-24 NOTE — ED Notes (Signed)
Mom's number Georgetta Haber [phone]  [phone]

## 2021-05-24 NOTE — ED Notes (Signed)
Pt refuses to allow this writer to place SpO2 sensor on her finger, started screaming "Jesus please forgive me for the sins that I have committed!"

## 2021-05-24 NOTE — Progress Notes (Signed)
Patient has been denied by [facility] due to no appropriate beds available. Patient meets BH inpatient criteria per Maxie Barb, NP. Patient has been faxed out to the following facilities:   [facility]  [address] [place] [address] [phone]  [facility]  [address], [place] [address] [phone]  [facility]  [address] [place] [address] [phone]  [facility]  [address], [place] [address] [phone]  [facility]  [address] [place] [address] [phone]  [facility]  [address], [place] [address] [phone]  [facility]  [address] [place], [place] [address] [phone]  [facility]  [address] [place], [place] [address] [phone]  [facility]  [phone] [place] [address] [phone]  [facility]  [address] [place] [address] [phone]  [facility]  [address], [place] [address] [phone]  [facility]  [address] [place] [address] [phone]  [facility]  [address], [place] [address] [phone]  [facility]  [address] [place] [address] [phone]  [facility]  [address] [place] [address] [phone]   Damita Dunnings, MSW, LCSW-A  3:03 PM 05/24/2021

## 2021-05-24 NOTE — Progress Notes (Signed)
CSW requested [facility] Oluwatosin, RN to review pt for [facility] CSW awaiting response. CSW informed that there are no beds at [facility].  Maryjean Ka, MSW, LCSWA 05/24/2021 11:00 PM

## 2021-05-24 NOTE — ED Provider Notes (Signed)
[facility]-EMERGENCY DEPT Provider Note   CSN: [id number] Arrival date & time: 05/24/21  0403     History  Chief Complaint  Patient presents with   Hallucinations   LEVEL 5 CAVEAT - PSYCHIATRIC ILLNESS  Kelsey Gray is a 46 y.o. female with PMHx schizophrenia who presents to the ED today via EMS for hallucinations. Per EMS pt jumped off a balcony last week and broke her left ankle. No additional information provided. Pt in the ED praying and then sporadically screaming at the top of her lungs. History significantly limited s/2 psychiatric illness.   Per chart review:  Pt admitted to [facility] on 05/06 after jumping off 3rd story and suffering trimalleolar fracture/dislocation. Also noted to be experiencing psychiatric illness at that time. Pt underwent surgery however given she had splint on/crutches it was difficult to admit pt to [facility] given concern for weapons. Pt was transferred to [facility] at that time. Unknown when she was discharged from facility.   The history is provided by the patient, medical records and the EMS personnel. The history is limited by the condition of the patient.      Home Medications Prior to Admission medications   Medication Sig Start Date End Date Taking? Authorizing Provider  ferrous sulfate 325 (65 FE) MG tablet Take 1 tablet (325 mg total) by mouth 3 (three) times daily with meals. For anemia 09/04/17   Armandina Stammer I, NP  hydrOXYzine (ATARAX/VISTARIL) 25 MG tablet Take 1 tablet (25 mg total) by mouth every 6 (six) hours as needed for anxiety. 09/04/17   Armandina Stammer I, NP  mirtazapine (REMERON) 15 MG tablet Take 1 tablet (15 mg total) by mouth at bedtime. For depression/sleep 09/04/17   Armandina Stammer I, NP  risperiDONE (RISPERDAL) 2 MG tablet Take 1 tablet (2 mg total) by mouth at bedtime. For mood control 09/04/17   Nwoko, Nicole Kindred I, NP  senna-docusate (SENOKOT-S) 8.6-50 MG tablet Take 1 tablet by mouth 2 (two)  times daily. (May buy from over the counter): For constipation 09/04/17   Sanjuana Kava, NP      Allergies    Patient has no known allergies.    Review of Systems   Review of Systems  Unable to perform ROS: Psychiatric disorder  Constitutional:  Negative for fever.   Physical Exam Updated Vital Signs BP 100/72   Pulse 70   Temp 98.6 F (37 C) (Oral)   Resp 17   SpO2 97%  Physical Exam Vitals and nursing note reviewed.  HENT:     Head: Normocephalic and atraumatic.  Eyes:     Conjunctiva/sclera: Conjunctivae normal.  Cardiovascular:     Rate and Rhythm: Normal rate and regular rhythm.  Pulmonary:     Effort: Pulmonary effort is normal.     Breath sounds: Normal breath sounds.  Abdominal:     Palpations: Abdomen is soft.     Tenderness: There is no abdominal tenderness.  Musculoskeletal:     Cervical back: Neck supple.  Skin:    General: Skin is warm and dry.  Neurological:     Mental Status: She is alert.  Psychiatric:        Attention and Perception: She perceives auditory hallucinations.        Mood and Affect: Affect is inappropriate.        Speech: Speech is rapid and pressured.    ED Results / Procedures / Treatments   Labs (all labs ordered are listed,  but only abnormal results are displayed) Labs Reviewed  COMPREHENSIVE METABOLIC PANEL - Abnormal; Notable for the following components:      Result Value   Glucose, Bld 113 (*)    BUN 26 (*)    All other components within normal limits  CBC WITH DIFFERENTIAL/PLATELET - Abnormal; Notable for the following components:   Hemoglobin 11.0 (*)    MCV 78.7 (*)    MCH 23.4 (*)    MCHC 29.7 (*)    RDW 25.9 (*)    All other components within normal limits  RESP PANEL BY RT-PCR (FLU A&B, COVID) ARPGX2  ETHANOL  RAPID URINE DRUG SCREEN, HOSP PERFORMED  I-STAT BETA HCG BLOOD, ED ([facility], [facility], [facility] ONLY)    EKG None  Radiology No results found.  Procedures Procedures    Medications Ordered in ED Medications   ziprasidone (GEODON) injection 20 mg (20 mg Intramuscular Given 05/24/21 0443)  sterile water (preservative free) injection (1.2 mLs  Given 05/24/21 0443)    ED Course/ Medical Decision Making/ A&P                           Medical Decision Making 46 year old female who presents to the ED today via EMS for hallucinations.  History significantly limited due to psychiatric illness.  Patient is seen crying in the room and then sporadically will scream at the top of her lungs.  She does not engage with me during conversation.  Her vitals are unremarkable.  Per chart review patient recently admitted secondary to jumping off a third story and suffering a trimalleolar fracture of her left ankle.  She underwent surgery and then was admitted to [facility].  Difficult to assess per chart review when patient was discharged.  Unknown how patient came to [place].  She does have history of schizophrenia.  Given patient does appear to have psychotic features at this time IVC paperwork has been taken out.  Geodon has been provided.  We will plan for labs for medical screening purposes and TTS consults.  CBC without leukocytosis. Hgb stable at 11.0 CMP with glucose 113. Creatinine 0.72, BUN slightly elevated at 26. No other electrolyte abnormalities.  Beta hcg negative COVID and flu negative ETOH negative  Pt is medically screened at this time. Pending TTS consult. Med rec needs to be performed prior to meds being ordered. Oncoming team to take over care.   Amount and/or Complexity of Data Reviewed Labs: ordered.  Risk Prescription drug management.          Final Clinical Impression(s) / ED Diagnoses Final diagnoses:  Hallucinations    Rx / DC Orders ED Discharge Orders     None         Tanda Rockers, PA-C 05/24/21 6834    Geoffery Lyons, MD 05/25/21 [phone]

## 2021-05-24 NOTE — ED Notes (Signed)
Pt changed into purple scrubs. One patient belongings bag placed at nurse's station locker for 16-18, Res A

## 2021-05-24 NOTE — ED Triage Notes (Signed)
Pt BIB EMS with reports of hallucinations. Pt jumped off of a balcony last week and broke her left ankle. Pt has been screaming.

## 2021-05-24 NOTE — ED Notes (Signed)
Pt is repeatedly screaming intermittently. Pt expresses this is her way of"praying"

## 2021-05-24 NOTE — ED Notes (Addendum)
Pt's mother and father at bedside. Mother Bonita Quin) requested that if pt is admitted for inpatient services then she be faxed to [facility] in [place]. Mother would like an update once a destination is decided upon. Cell phone [phone], Home phone [phone]

## 2021-05-25 NOTE — BH Assessment (Addendum)
Disposition: @1147 , Patient continues to meet criteria for inpatient psychiatric treatment, per , NP. Re-faxed referrals to multiple facilities for consideration of inpatient treatment.   Destination Service Provider   Address Phone Fax Patient Preferred  [facility]    [address] [place], [place] [phone] --  [facility]    [address] [place] [phone] --  [facility]    [address] [place] [phone] --  [facility]    [address] [place] [phone] --  [facility]    [address] [place] [phone] --  [facility]    [address] [place] [phone] --  [facility]    [address] [place], [place] [phone] --  [facility]    [address] [place], [place] [phone] --  [facility]    [phone] [address] [place] [phone] --  [facility]    [address] [place] [phone] --  [facility]    [address] [place] [phone] --  [facility]    [address] [place] [phone] --  [facility]    [address], [place] [phone] --  [facility]    [address] [place] [phone] --  [facility]    [address] [place] [phone] --  [facility]    [address] [place] [place] [phone] --  [facility]    [address] [place] [phone] --  [facility] [facility] [place] [phone] --   [facility]    [address] [place] [address] [place] [phone] --  [facility]    [address] [place] [phone] --  [facility]    [address], [place] [phone] --  [facility]    [address] [place], [place] [phone] --  [facility]    [address] [place] [phone] --  [facility]    [address], [place] [phone] --  [facility]    [address] [place] [address] [place] [phone] --  [facility]    [address], [address] [place] [phone]
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
## 2021-05-25 NOTE — ED Notes (Signed)
Pt is alert and oriented x 4. She denies SI/HI. She seems lucid and aware of her situation. She states today is the first day she has not taken narcotics and pain in her foot is well controlled without them. Denies needs.

## 2021-05-25 NOTE — ED Provider Notes (Signed)
  Physical Exam  BP (!) 121/93 (BP Location: Left Arm)   Pulse 69   Temp 98.3 F (36.8 C) (Oral)   Resp 18   SpO2 97%   Physical Exam  Procedures  Procedures  ED Course / MDM    Medical Decision Making Amount and/or Complexity of Data Reviewed Labs: ordered.  Risk Prescription drug management.   Has been here 27 hours.  Pending inpatient placement.  No issues overnight       Benjiman Core, MD 05/25/21 [phone]

## 2021-05-25 NOTE — Progress Notes (Signed)
Inpatient Behavioral Health Placement  Pt meets inpatient criteria per Maxie Barb, NP. There are no available beds at [facility] per [facility] Oluwat[facility], RN. Referral was sent to the following facilities;   Destination Service Provider Address Phone Fax  [facility]  [address] [place], [place] [address] [phone]  [facility]  [address], [place] [address] [phone]  [facility]  [address] [place] [address] [phone]  [facility]  [address] [place] [address] [phone]  [facility]  [address] [place] [address] [phone]  [facility]  [address], [place] [address] [phone]  [facility]  [address] [place], [place] [address] [phone]  [facility]  [address] [place], [place] [address] [phone]  [facility]  [phone] [address] [place] [address] [phone]  [facility]  [address] [place] [address] [phone]  [facility]  [address], [place] [address] [phone]  [facility]  [address], [place] [address] [phone]  [facility]  [address], [place] [address] [phone]  [facility]  [address] [place] [address] [phone]  [facility]  [address], [place] [address] [phone]  [facility]  [address] [place] [place] [address] [phone]  [facility]  [address] [place] [address] [phone]  [facility] [address] [place] [address] [phone]  [facility]  [address] [place] [address] [phone]  [facility]  [address] [place] [address] [phone]  [facility]  [address] [place] [address] [phone]     Situation ongoing,  CSW will follow up.   Maryjean Ka, MSW, LCSWA 05/25/2021  @ 1:00 AM

## 2021-05-26 NOTE — Progress Notes (Signed)
Inpatient Behavioral Health Placement   CSW faxed referral to the following facility per the request of pt's mother. CSW and disposition team will continue to assist with placement.  [facility]  Pending - Request Sent N/A [address], [place] [address] [phone]     Maryjean Ka, MSW, [facility] 05/26/2021 3:06 AM

## 2021-05-26 NOTE — ED Provider Notes (Signed)
Emergency Medicine Observation Re-evaluation Note  Kelsey Gray is a 46 y.o. female, seen on rounds today.  Pt initially presented to the ED for complaints of Hallucinations and Psychiatric Evaluation Currently, the patient is resting.  Physical Exam  BP 116/82 (BP Location: Left Arm)   Pulse 68   Temp 98.1 F (36.7 C) (Oral)   Resp 18   SpO2 98%  Physical Exam  General: calm, no distress Cardiac: warm well perfused Lungs: even unlabored Psych: calm  ED Course / MDM  EKG:   I have reviewed the labs performed to date as well as medications administered while in observation.  Recent changes in the last 24 hours include psych continues to look for placement.  Plan  Current plan is for placement per psych team. [facility].      Milagros Loll, MD 05/26/21 [phone]

## 2021-05-26 NOTE — ED Notes (Signed)
TTS at bedside. 

## 2021-05-26 NOTE — Consult Note (Addendum)
Telepsych Consultation   Reason for Consult:  psych. IVC Referring Physician:  Christean Grief PA-C Location of Patient:  [place] [facility] Location of Provider: Behavioral Health TTS Department  Patient Identification: Kelsey Gray MRN:  [id number] Principal Diagnosis: Schizoaffective disorder [facility]) Diagnosis:  Principal Problem:   Schizoaffective disorder (HCC)   Total Time spent with patient: 20 minutes  Subjective:   Kelsey Gray is a 46 y.o. female patient admitted with hallucinations.  Patient presents alert and oriented. Pleasant affect. "I came in on the 19th because my mother was concerned about me because I was yelling and screaming but I was praying and she wanted me to have someone to talk with so she called the police who called the paramedics". Continues to endorse spiritual warfare with some religious preoccupations admits she it's "not as much as it was the other day". States she has been taking 0.25 mg Risperidal daily; provider explained the prescription being for 1.5 mg in the morning and 0.25 night. Says she was on her way to the leasing office but heard a voice telling her it would be faster if she jumped; patient jumped from 3rd floor, breaking her left ankle. Lives alone; has 72 year old daughter who lives with dad/stepmom, visits were every other weekend. Ex-husband currently has emergency full time custody related to pt's decline in mental health. 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 Has undergraduate degree from [employer] and master's in public health [employer].  Patient states she continues to have "thoughts" that she is unable to tell if it's "God's voice or her thoughts" in the form of voices. Endorses getting adequate sleep and eating meals. She denies suicidal or homicidal ideations; appears calm and not responding to any external/internal stimuli at this time.   HPI:  Kelsey Gray is a 46 year old female  patient with past psychiatric history of schizophrenia, bipolar disorder, schizoaffective disorder who presented to [facility] via EMS for hallucinations where she was noted to be screaming. Of note patient presented to ED 05/13/21 after jumping off balcony and breaking her left ankle. UDS-, BAL<10. PDMP reviewed, no prescriptions noted.  Past Psychiatric History: schizophrenia, bipolar disorder, schizoaffective disorder  Risk to Self:   Risk to Others:   Prior Inpatient Therapy:   Prior Outpatient Therapy:    Past Medical History:  Past Medical History:  Diagnosis Date   Medical history non-contributory    History reviewed. No pertinent surgical history. Family History: History reviewed. No pertinent family history. Family Psychiatric  History: not noted Social History:  Social History   Substance and Sexual Activity  Alcohol Use Not Currently     Social History   Substance and Sexual Activity  Drug Use Never    Social History   Socioeconomic History   Marital status: Single    Spouse name: Not on file   Number of children: Not on file   Years of education: Not on file   Highest education level: Not on file  Occupational History   Not on file  Tobacco Use   Smoking status: Never   Smokeless tobacco: Never  Vaping Use   Vaping Use: Never used  Substance and Sexual Activity   Alcohol use: Not Currently   Drug use: Never   Sexual activity: Not on file  Other Topics Concern   Not on file  Social History Narrative   Not on file   Social Determinants of Health   Financial Resource Strain: Not  on file  Food Insecurity: Not on file  Transportation Needs: Not on file  Physical Activity: Not on file  Stress: Not on file  Social Connections: Not on file   Additional Social History:   Allergies:  No Known Allergies  Labs: No results found for this or any previous visit (from the past 48 hour(s)).  Medications:  Current Facility-Administered Medications  Medication  Dose Route Frequency Provider Last Rate Last Admin   risperiDONE (RISPERDAL) tablet 0.25 mg  0.25 mg Oral QHS Leevy-Johnson, Keoni Risinger A, NP   0.25 mg at 05/25/21 2227   risperiDONE (RISPERDAL) tablet 1.5 mg  1.5 mg Oral BID Leevy-Johnson, Alaura Schippers A, NP   1.5 mg at 05/26/21 0805   traMADol (ULTRAM) tablet 50 mg  50 mg Oral Q6H PRN Jacalyn LefevreHaviland, Julie, MD       Current Outpatient Medications  Medication Sig Dispense Refill   aspirin EC 81 MG tablet Take 81 mg by mouth 2 (two) times daily. Swallow whole.     risperiDONE (RISPERDAL) 0.25 MG tablet Take 0.25 mg by mouth at bedtime.     traMADol (ULTRAM) 50 MG tablet Take 50 mg by mouth every 6 (six) hours as needed for moderate pain.     risperiDONE (RISPERDAL) 0.5 MG tablet Take 1.5 mg by mouth 2 (two) times daily. (Patient not taking: Reported on 05/24/2021)     Musculoskeletal: Strength & Muscle Tone: within normal limits Gait & Station: normal Patient leans: N/A  Psychiatric Specialty Exam:  Presentation  General Appearance: Casual  Eye Contact:Good  Speech:Clear and Coherent  Speech Volume:Normal  Handedness:No data recorded  Mood and Affect  Mood:Euthymic  Affect:Constricted; Congruent  Thought Process  Thought Processes:Goal Directed; Coherent  Descriptions of Associations:Circumstantial  Orientation:Partial  Thought Content:Delusions; Other (comment) (religiously preoccupied)  History of Schizophrenia/Schizoaffective disorder:No  Duration of Psychotic Symptoms:N/A  Hallucinations:Hallucinations: Auditory Description of Auditory Hallucinations: voice of God  Ideas of Reference:Delusions  Suicidal Thoughts:Suicidal Thoughts: No  Homicidal Thoughts:Homicidal Thoughts: No  Sensorium  Memory:Recent Good; Immediate Fair; Remote Good  Judgment:Impaired  Insight:Shallow  Executive Functions  Concentration:Good  Attention Span:Good  Recall:Good  Fund of Knowledge:Good  Language:Good  Psychomotor Activity   Psychomotor Activity:Psychomotor Activity: Normal  Assets  Assets:Physical Health; Resilience; Social Support; Tax adviserVocational/Educational; Transportation; Financial Resources/Insurance; Housing; Intimacy; Desire for Improvement; Communication Skills  Sleep  Sleep:Sleep: Fair  Physical Exam: Physical Exam Vitals and nursing note reviewed.  Constitutional:      Appearance: She is normal weight.  HENT:     Head: Normocephalic.     Nose: Nose normal.     Mouth/Throat:     Mouth: Mucous membranes are moist.     Pharynx: Oropharynx is clear.  Eyes:     Pupils: Pupils are equal, round, and reactive to light.  Cardiovascular:     Rate and Rhythm: Normal rate.     Pulses: Normal pulses.  Pulmonary:     Effort: Pulmonary effort is normal.  Abdominal:     General: Abdomen is flat.  Musculoskeletal:        General: Normal range of motion.     Cervical back: Normal range of motion.  Skin:    General: Skin is warm and dry.  Neurological:     Mental Status: She is alert and oriented to person, place, and time.  Psychiatric:        Behavior: Behavior is withdrawn.        Thought Content: Thought content is delusional.  Cognition and Memory: Memory normal.        Judgment: Judgment is impulsive.   Review of Systems  Musculoskeletal:        Left ankle broken  Psychiatric/Behavioral:  Positive for hallucinations.   Blood pressure 118/79, pulse 84, temperature 97.8 F (36.6 C), temperature source Oral, resp. rate 16, SpO2 99 %. There is no height or weight on file to calculate BMI.  Treatment Plan Summary: Daily contact with patient to assess and evaluate symptoms and progress in treatment, Medication management, and Plan continue to seek inpatient hospitalization for further observation, stabilization, and treatment.   Disposition: Recommend psychiatric Inpatient admission when medically cleared. Supportive therapy provided about ongoing stressors. Discussed crisis plan, support  from social network, calling 911, coming to the Emergency Department, and calling Suicide Hotline.  This service was provided via telemedicine using a 2-way, interactive audio and video technology.  Names of all persons participating in this telemedicine service and their role in this encounter. Name: Maxie Barb Role: PMHNP  Name: Nelly Rout Role: Attending MD  Name: Kelsey Gray Role: patient  Name:  Role:     Loletta Parish, NP 05/26/2021 5:31 PM

## 2021-05-26 NOTE — Progress Notes (Signed)
Inpatient Behavioral health placement  Pt meets inpatient criteria per Maxie Barb, NP. There are no available beds at [facility] per [facility] Fransico Michael, RN.  Referral was sent to the following facilities;    Destination Service Provider Address Phone Fax  [facility]  [address] [place], [place] [address] [phone]  [facility]  [address] [place] [address] [phone]  [facility]  [address], [place] [address] [phone]  [facility]  [address] [place] [address] [phone]  [facility]  [address] [place] [address] [phone]  [facility]  [address], [place] [address] [phone]  [facility]  [address] [place], [place] [address] [phone]  [facility]  [address] [place], [place] [address] [phone]  [facility]  [phone] [address] [place] [place] [address] [phone]  [facility]  [address] [place] [address] [phone]  [facility]  [address], [place] [address] [phone]  [facility]  [address], [place] [address] [phone]  [facility]  [address] [place] [address] [phone]  [facility]  [address], [place] [address] [phone]  [facility]  [address] [place] [address] [phone]  [facility]  [address] [place]., [place] [address] [phone]  [facility]  [address] [place] [address] [phone]  [facility] [address] [place] [address] [phone]  [facility]  [address] [place] [address] [phone]  [facility]  [address] [place] [address] [phone]  [facility]  [address], [place] [address] [phone]  [facility]  [address] [place], [place] [address] [phone]  [facility]  [address] [place] [address] [phone]  [facility]  [address], [place] [address] [phone]  [facility]  [address], [place] [address] [phone]  [facility]  [address] [place] [address] [phone]  [facility]  [address] [place] [address] [phone]   Situation ongoing,  CSW will follow up.   Maryjean Ka, MSW, [facility] 05/26/2021  @ 3:44 AM

## 2021-05-27 NOTE — Progress Notes (Signed)
Inpatient Behavioral Health Placement   Pt meets inpatient criteria per Dahlia Byes, NP. There are no available beds. Referral was sent to out of network facilities: Destination Service Provider Address Phone Fax  [facility]  [address] [place], [place] [address] [phone]  [facility]  [address] [place] [address] [phone]  [facility]  [address], [place] [address] [phone]  [facility]  [address], [place] [address] [phone]  [facility]  [address] [place] [address] [phone]  [facility]  [address] [place] [address] [phone]  [facility]  [address] [place], [place] [address] [phone]  [facility]  [address] [place], [place] [address] [phone]  [facility]  [phone] [address] [place] [address] [phone]  [facility]  [address] [place] [address] [phone]  [facility]  [address] [place] [address] [phone]  [facility]  [address], [place] [address] [phone]  [facility]  [address], [place] [address] [phone]  [facility]  [address] [place] [address] [phone]  [facility]  [address], [place] [address] [phone]  [facility]  [address] [place] [address] [phone]  [facility]  [address] [place]  [address] [phone]  [facility] [address] [place] [address] [phone]  [facility]  [address] [place] [address] [phone]  [facility]  [address], [place] [address] [phone]  [facility]  [address] [place] [address] [phone]  [facility]  [address] [place], [place] [address] [phone]  [facility]  [address] [place] [address] [phone]  [facility]  [address], [place] [address] [phone]  [facility]  [address] [place] [address] [phone]  [facility]  [address] [place] [address] [phone]  [facility]  [address] [place] [address] [phone]    Situation ongoing,  CSW will follow up.   Maryjean Ka, MSW, [facility] 05/27/2021  @ 10:28 PM;

## 2021-05-27 NOTE — ED Provider Notes (Signed)
Emergency Medicine Observation Re-evaluation Note  Kelsey Gray is a 46 y.o. female, seen on rounds today.  Pt initially presented to the ED for complaints of Hallucinations and Psychiatric Evaluation Currently, the patient is resting quietly.  Physical Exam  BP 103/69   Pulse 67   Temp 98.9 F (37.2 C) (Oral)   Resp 16   SpO2 100%  Physical Exam General: No acute distress Cardiac: Well-perfused Lungs: Nonlabored Psych: Cooperative  ED Course / MDM  EKG:   I have reviewed the labs performed to date as well as medications administered while in observation.  Recent changes in the last 24 hours include TTS evaluation.  Plan  Current plan is for inpatient. [facility] is under [facility] commitment.      Kelsey Files, MD 05/27/21 [phone]

## 2021-05-27 NOTE — Consult Note (Signed)
[facility] Psych ED Progress Note  05/27/2021 1:30 PM Kelsey Gray  MRN:  [id number]   Method of visit?: Face to Face   Subjective:  Kelsey Gray is a 46 year old female patient with past psychiatric history of schizophrenia, bipolar disorder, schizoaffective disorder who presented to [facility] via EMS for hallucinations where she was noted to be screaming. Of note patient presented to ED 05/13/21 after jumping off balcony and breaking her left ankle. Today patient was seen calm, cooperative and reported that she stopped taking Medications for two weeks because she was feeling well.  Now she stated she does not want to miss her medications anymore.  She admitted that wrong choice made her jump off a second floor building to go the her apartment office.  Foot is broken and is in a cast.  She declined offer for injectable long acting antipsychotic and want to continue taking oral dose.  She denied SI/HI/AVH and no mention of paranoia.  DR Jannifer Franklin, Psychiatrist is her outpatient Provider.Will continue to seek inpatient hospitalization. Principal Problem: Schizoaffective disorder (HCC) Diagnosis:  Principal Problem:   Schizoaffective disorder (HCC)  Total Time spent with patient: 20 minutes  Past Psychiatric History: see initial Psych Consult note  Past Medical History:  Past Medical History:  Diagnosis Date   Medical history non-contributory    History reviewed. No pertinent surgical history. Family History: History reviewed. No pertinent family history. Family Psychiatric  History: see initial consult note Social History:  Social History   Substance and Sexual Activity  Alcohol Use Not Currently     Social History   Substance and Sexual Activity  Drug Use Never    Social History   Socioeconomic History   Marital status: Single    Spouse name: Not on file   Number of children: Not on file   Years of education: Not on file   Highest education level: Not on file  Occupational History   Not  on file  Tobacco Use   Smoking status: Never   Smokeless tobacco: Never  Vaping Use   Vaping Use: Never used  Substance and Sexual Activity   Alcohol use: Not Currently   Drug use: Never   Sexual activity: Not on file  Other Topics Concern   Not on file  Social History Narrative   Not on file   Social Determinants of Health   Financial Resource Strain: Not on file  Food Insecurity: Not on file  Transportation Needs: Not on file  Physical Activity: Not on file  Stress: Not on file  Social Connections: Not on file    Sleep: Fair  Appetite:  Fair  Current Medications: Current Facility-Administered Medications  Medication Dose Route Frequency Provider Last Rate Last Admin   risperiDONE (RISPERDAL) tablet 0.25 mg  0.25 mg Oral QHS Leevy-Johnson, Brooke A, NP   0.25 mg at 05/26/21 2150   risperiDONE (RISPERDAL) tablet 1.5 mg  1.5 mg Oral BID Leevy-Johnson, Brooke A, NP   1.5 mg at 05/27/21 0800   traMADol (ULTRAM) tablet 50 mg  50 mg Oral Q6H PRN Jacalyn Lefevre, MD       Current Outpatient Medications  Medication Sig Dispense Refill   aspirin EC 81 MG tablet Take 81 mg by mouth 2 (two) times daily. Swallow whole.     risperiDONE (RISPERDAL) 0.25 MG tablet Take 0.25 mg by mouth at bedtime.     traMADol (ULTRAM) 50 MG tablet Take 50 mg by mouth every 6 (six) hours as needed for moderate  pain.     risperiDONE (RISPERDAL) 0.5 MG tablet Take 1.5 mg by mouth 2 (two) times daily. (Patient not taking: Reported on 05/24/2021)      Lab Results: No results found for this or any previous visit (from the past 48 hour(s)).  Blood Alcohol level:  Lab Results  Component Value Date   ETH <10 05/24/2021   ETH <10 08/26/2017    Physical Findings: AIMS:  , ,  ,  ,    CIWA:    COWS:     Musculoskeletal: Strength & Muscle Tone:  in bed during encounter Gait & Station:  lying down during encounter Patient leans:  see above.  Psychiatric Specialty Exam:  Presentation  General  Appearance: Casual; Neat  Eye Contact:Good  Speech:Clear and Coherent; Normal Rate  Speech Volume:Normal  Handedness:Right  Mood and Affect  Mood:Euthymic  Affect:Congruent   Thought Process  Thought Processes:Coherent; Goal Directed  Descriptions of Associations:Intact  Orientation:Partial  Thought Content:Logical  History of Schizophrenia/Schizoaffective disorder:Yes  Duration of Psychotic Symptoms:Greater than six months  Hallucinations:Hallucinations: None Description of Auditory Hallucinations: voice of God  Ideas of Reference:None  Suicidal Thoughts:Suicidal Thoughts: No  Homicidal Thoughts:Homicidal Thoughts: No   Sensorium  Memory:Immediate Good; Recent Good; Remote Good  Judgment:Fair  Insight:Good   Executive Functions  Concentration:Good  Attention Span:Good  Recall:Good  Fund of Knowledge:Good  Language:Good   Psychomotor Activity  Psychomotor Activity:Psychomotor Activity: Normal   Assets  Assets:Communication Skills; Desire for Improvement; Housing; Physical Health   Sleep  Sleep:Sleep: Fair    Physical Exam: Physical Exam Constitutional:      Appearance: Normal appearance.  HENT:     Head: Normocephalic and atraumatic.     Nose: Nose normal.  Cardiovascular:     Rate and Rhythm: Normal rate and regular rhythm.  Pulmonary:     Effort: Pulmonary effort is normal.  Musculoskeletal:        General: Normal range of motion.     Cervical back: Normal range of motion.  Skin:    General: Skin is warm and dry.  Neurological:     General: No focal deficit present.     Mental Status: She is alert and oriented to person, place, and time.   Review of Systems  Constitutional: Negative.   HENT: Negative.    Eyes: Negative.   Respiratory: Negative.    Cardiovascular: Negative.   Gastrointestinal: Negative.   Genitourinary: Negative.   Musculoskeletal: Negative.   Skin: Negative.   Neurological: Negative.    Endo/Heme/Allergies: Negative.   Psychiatric/Behavioral:  Positive for hallucinations.   Blood pressure 103/66, pulse 81, temperature 98.9 F (37.2 C), temperature source Oral, resp. rate 16, SpO2 98 %. There is no height or weight on file to calculate BMI.  Treatment Plan Summary: Daily contact with patient to assess and evaluate symptoms and progress in treatment, Medication management, and Plan Continue current Medications as prescribed.  She  HAS DECLINED LAI Risperidone or any other injection.  Continue to seek inpatient Hospitalization.  Earney Navy, NP-PMHNP-BC 05/27/2021, 1:30 PM

## 2021-05-28 MED ORDER — RISPERIDONE 2 MG PO TABS
2.0000 mg | ORAL_TABLET | Freq: Every day | ORAL | Status: DC
  Administered 2021-05-28 – 2021-05-29 (×2): 2 mg via ORAL
  Filled 2021-05-28 (×2): qty 1

## 2021-05-28 MED ORDER — RISPERIDONE 1 MG PO TABS
1.5000 mg | ORAL_TABLET | Freq: Every day | ORAL | Status: DC
  Administered 2021-05-29 – 2021-05-30 (×2): 1.5 mg via ORAL
  Filled 2021-05-28 (×2): qty 1

## 2021-05-28 NOTE — Consult Note (Signed)
Patient continues to wait for admission bed to inpatient Psych unit.  She continues to read her Bible all the time except when eating or sleeping.  She is not praying out loud or hallucinating.  Her Risperidone  is changed to 1.5 mg in am and 2 mg at night time.  So far no side effect reported as she takes Risperidone.  Patient denies SI/HI/ Aat this time.  She and her mother are hoping for a long term hospitalization at [facility].  Provider informed her she does not meet criteria for the [facility] hospital.  We will continue to fax out records and manage Medications.

## 2021-05-28 NOTE — Progress Notes (Signed)
Patient has been denied by [facility] due to no appropriate beds available for this Pt. Patient meets BH inpatient criteria per Maxie Barb, NP. Patient has been faxed out to the following facilities:   [facility]  [address] [place] [address] [phone]  [facility]  [address], [place] [address] [phone]  [facility]  [address] [place] [address] [phone]  [facility]  [address], [place] [address] [phone]  [facility]  [address] [place] [address] [phone]  [facility]  [address] [place] [address] [phone]  [facility]  [address] [place], [place] [address] [phone]  [facility]  [address] [place], [place] [address] [phone]  [facility]  [phone] [place] [address] [phone]  [facility]  [address] [place] [address] [phone]  [facility]  [address] [place] [address] [phone]  [facility]  [address], [place] [address] [phone]  [facility]  [address], [place] [address] [phone]  [facility]  [address], [place] [address] [phone]  [facility]  [address] [place] [address] [phone]  [facility]  [address] [place] [place] [address] [phone]  [facility]  [address] [place] [address]  [phone]  [facility] [address] [place] [address] [phone]  [facility]  [address] [place] [address] [phone]  [facility]  [address] [place] [address] [phone]  [facility]  [address], [place] [address] [phone]  [facility]  [address] [place], [place] [address] [phone]  [facility]  [address] [place] [address] [phone]  [facility]  [address], [place] [address] [phone]  [facility]  [address] [place] [address] [phone]  [facility]  [address] [place] [address] [phone]  [facility]  [address] [place] [address] [phone]   Damita Dunnings, MSW, LCSW-A  11:39 AM 05/28/2021

## 2021-05-28 NOTE — ED Provider Notes (Signed)
Emergency Medicine Observation Re-evaluation Note  Kelsey Gray is a 46 y.o. female, seen on rounds today.  Pt initially presented to the ED for complaints of Hallucinations and Psychiatric Evaluation Currently, the patient is sleeping.  Physical Exam  BP 110/70 (BP Location: Right Arm)   Pulse 62   Temp 98.3 F (36.8 C) (Oral)   Resp 16   SpO2 99%  Physical Exam General: Sleeping, nondistressed Cardiac: Well-perfused Lungs: Breathing unlabored Psych: Deferred  ED Course / MDM  EKG:   I have reviewed the labs performed to date as well as medications administered while in observation.  Recent changes in the last 24 hours include none.  Plan  Current plan is for inpatient psych. [facility] is under [facility] commitment.      Godfrey Pick, MD 05/28/21 [phone]

## 2021-05-29 NOTE — ED Provider Notes (Signed)
Emergency Medicine Observation Re-evaluation Note  Kelsey Gray is a 46 y.o. female, seen on rounds today.  Pt initially presented to the ED for complaints of Hallucinations and Psychiatric Evaluation Currently, the patient is resting.  Physical Exam  BP 104/70 (BP Location: Right Arm)   Pulse 75   Temp 98 F (36.7 C) (Oral)   Resp 18   SpO2 99%  Physical Exam General: No acute distress Cardiac: Regular rate Lungs: Normal respiratory effort Psych: Deferred  ED Course / MDM  EKG:   I have reviewed the labs performed to date as well as medications administered while in observation.  Recent changes in the last 24 hours include no acute changes.  Plan  Current plan is for inpatient psychiatric treatment. [facility] is under involuntary commitment.      Linwood Dibbles, MD 05/29/21 0800

## 2021-05-29 NOTE — ED Notes (Signed)
Pt told this Clinical research associate she would like "social workers to come between 10am - 2pm". States that's the time she is usually awake and would like to be fully awake when speaking to social workers. Also, pt would like to speak to a "[facility]". RN notified.

## 2021-05-29 NOTE — ED Notes (Signed)
Patient is requesting to please see social worker between 10am and 2pm when she is fully awake. She really appreciates if we can make this happen.

## 2021-05-29 NOTE — Progress Notes (Signed)
[facility] has declined this patient due to her wearing a cast. CSW will continue to seek recommended disposition.   Mariea Clonts, MSW, LCSW-A  10:14 PM 05/29/2021

## 2021-05-29 NOTE — ED Notes (Signed)
Pt assisted to the restroom. Pt remains calm and cooperative.

## 2021-05-29 NOTE — Progress Notes (Signed)
Patient has been denied by [facility] and has been faxed out. Patient meets BH inpatient criteria per Maxie Barb, NP. Patient has been faxed out to the following facilities:   [facility]  [address] [place] [address] [phone]  [facility]  [address], [place] [address] [phone]  [facility]  [address] [place] [address] [phone]  [facility]  [address], [place] [address] [phone]  [facility]  [address] [place] [address] [phone]  [facility]  [address], [place] [address] [phone]  [facility]  [address] [place], [place] [address] [phone]  [facility]  [address] [place], [place] [address] [phone]  [facility]  [phone] [place] [address] [place] [address] [phone]  [facility]  [address] [place] [address] [phone]  [facility]  [address] [place] [address] [phone]  [facility]  [address] [place] [address] [phone]  [facility]  [address] [place] [address] [phone]  [facility]  [address] [place] [address] [phone]  [facility]  [address] [place] [address] [phone]  [facility]  [address] [place]., [place] [address] [phone]  [facility]  [address] [place] [address] [phone]   [facility] [address] [place] [address] [phone]  [facility]  [address], [place] [address] [phone]  [facility]  [address], [place] [address] [phone]  [facility]  [address] [place] [address] [phone]  [facility]  [address] [place], [place] [address] [phone]  [facility]  [address] [place] [address] [phone]  [facility]  [address] [place] [address] [phone]  [facility]  [address] [place] [address] [phone]  [facility]  [address] [place] [address] [phone]  [facility]  [address] [place] [address] [phone]   Damita Dunnings, MSW, LCSW-A  9:56 PM 05/29/2021

## 2021-05-29 NOTE — ED Notes (Signed)
Patient alert. Cooperative. Patient denied suicidal ideation. Patient denied homicidal ideation

## 2021-05-29 NOTE — ED Notes (Signed)
Patient stated that she was supposed to have a f/u ortho appointment on Monday in [place] where she had her surgery but was unable to make it. She is requesting to please see an orthopedic surgeon for f/u her at [facility].

## 2021-05-30 NOTE — ED Notes (Signed)
Pt personal belonging bag returned to pt.

## 2021-05-30 NOTE — Progress Notes (Signed)
Chaplain engaged in an initial visit with Kelsey Gray.  Kelsey Gray had a list of questions about the purpose and completion of an Scientist, physiological, Healthcare POA .  Chaplain was able to answer every question and provide clarity around the need, procedure, and criteria of an Advanced Directive.  Chaplain also provided clarity and distinction regarding capacity and mental healthcare directives.    After spending some time talking about Advanced Directives, conversation shifted to talk about her fears, her faith, and the spiritual warfare she has been in that sometimes makes her want to use curse words or "blaspheme God."  Chaplain offered reflective listening around what she has felt has been debilitating.  Chaplain also affirmed the help and resources she has been receiving to aide her in her journey.  Kelsey Gray described what she has been feeling and thinking as "bondage."  Chaplain worked to normalize imperfection in Kelsey Gray and as human beings.   Chaplain provided prayer with Kelsey Gray, reflective listening, and education.     05/30/21 1100  Clinical Encounter Type  Visited With Patient  Visit Type Initial;Spiritual support;Social support  Referral From Patient  Consult/Referral To Chaplain

## 2021-05-30 NOTE — ED Notes (Addendum)
Patient is sleeping. We will get VSs when she wakes up.

## 2021-05-30 NOTE — Consult Note (Signed)
Telepsych Consultation   Reason for Consult:  Hallucinations  Referring Physician:  EDP Location of Patient: [address] Location of Provider: [facility]  Patient Identification: Foster Sonnier MRN:  [id number] Principal Diagnosis: Schizoaffective disorder [facility]) Diagnosis:  Principal Problem:   Schizoaffective disorder (HCC)   Total Time spent with patient: 15 minutes  Subjective:   Quana Chamberlain is a 46 y.o. female patient was seen and evaluated via tele-assessment.  She is awake, alert and oriented x3.  Denying suicidal or homicidal ideations.  Denies auditory or visual hallucinations.  Patient observed resting in bed.  She recounts the reason for her admission.  States "I was just trying to repent and I was speaking loudly."  Patient reports she has a follow-up appointment today with Dr. Jannifer Franklin for medication management.    She provided verbal authorization to follow-up with her mother Georgetta Haber for additional collateral.  NP spoke to patient's mother regarding discharge disposition recommendations.  Mother had concerns with patient being placed in a "long-term facility" education was provided with stabilization and keeping all outpatient follow-up appointments.  Mother appeared receptive to plan.  Reports she will assist with taking patient to her follow-up appointment today.  Case staffed with attending psychiatrist MD Lucianne Muss.  Support, encouragement and  reassurance was provided.  HPI:  Per admission assessment note: Tyjah Hai is a 46 y.o. female that presents under involuntary commitment. " patient admitted with hallucinations. On assessment she presents disheveled in appearance, calm and cooperative. States she was admitted for "spiritual warfare". She is religiously preoccupied stating she is having "thoughts from the demonic realm". She endorses paranoid thoughts and auditory hallucinations. . She denies any suicidal or homicidal ideations, visual hallucinations. Staff is  reporting patient comes to the door and yells religious statements periodically."  I  Past Psychiatric History:   Risk to Self:   Risk to Others:   Prior Inpatient Therapy:   Prior Outpatient Therapy:    Past Medical History:  Past Medical History:  Diagnosis Date   Medical history non-contributory    History reviewed. No pertinent surgical history. Family History: History reviewed. No pertinent family history. Family Psychiatric  History:  Social History:  Social History   Substance and Sexual Activity  Alcohol Use Not Currently     Social History   Substance and Sexual Activity  Drug Use Never    Social History   Socioeconomic History   Marital status: Single    Spouse name: Not on file   Number of children: Not on file   Years of education: Not on file   Highest education level: Not on file  Occupational History   Not on file  Tobacco Use   Smoking status: Never   Smokeless tobacco: Never  Vaping Use   Vaping Use: Never used  Substance and Sexual Activity   Alcohol use: Not Currently   Drug use: Never   Sexual activity: Not on file  Other Topics Concern   Not on file  Social History Narrative   Not on file   Social Determinants of Health   Financial Resource Strain: Not on file  Food Insecurity: Not on file  Transportation Needs: Not on file  Physical Activity: Not on file  Stress: Not on file  Social Connections: Not on file   Additional Social History:    Allergies:  No Known Allergies  Labs: No results found for this or any previous visit (from the past 48 hour(s)).  Medications:  Current Facility-Administered Medications  Medication  Dose Route Frequency Provider Last Rate Last Admin   risperiDONE (RISPERDAL) tablet 1.5 mg  1.5 mg Oral Daily Onuoha, Josephine C, NP   1.5 mg at 05/29/21 1015   risperiDONE (RISPERDAL) tablet 2 mg  2 mg Oral QHS Onuoha, Josephine C, NP   2 mg at 05/29/21 2115   traMADol (ULTRAM) tablet 50 mg  50 mg Oral Q6H  PRN Jacalyn Lefevre, MD       Current Outpatient Medications  Medication Sig Dispense Refill   aspirin EC 81 MG tablet Take 81 mg by mouth 2 (two) times daily. Swallow whole.     risperiDONE (RISPERDAL) 0.25 MG tablet Take 0.25 mg by mouth at bedtime.     traMADol (ULTRAM) 50 MG tablet Take 50 mg by mouth every 6 (six) hours as needed for moderate pain.     risperiDONE (RISPERDAL) 0.5 MG tablet Take 1.5 mg by mouth 2 (two) times daily. (Patient not taking: Reported on 05/24/2021)      Musculoskeletal: Strength & Muscle Tone: within normal limits Gait & Station: normal Patient leans: N/A          Psychiatric Specialty Exam:  Presentation  General Appearance: Casual; Neat  Eye Contact:Good  Speech:Clear and Coherent; Normal Rate  Speech Volume:Normal  Handedness:Right   Mood and Affect  Mood:Euthymic  Affect:Congruent   Thought Process  Thought Processes:Coherent; Goal Directed  Descriptions of Associations:Intact  Orientation:Partial  Thought Content:Logical  History of Schizophrenia/Schizoaffective disorder:Yes  Duration of Psychotic Symptoms:Greater than six months  Hallucinations:No data recorded Ideas of Reference:None  Suicidal Thoughts:No data recorded Homicidal Thoughts:No data recorded  Sensorium  Memory:Immediate Good; Recent Good; Remote Good  Judgment:Fair  Insight:Good   Executive Functions  Concentration:Good  Attention Span:Good  Recall:Good  Fund of Knowledge:Good  Language:Good   Psychomotor Activity  Psychomotor Activity:No data recorded  Assets  Assets:Communication Skills; Desire for Improvement; Housing; Physical Health   Sleep  Sleep:No data recorded   Physical Exam: Physical Exam Vitals reviewed.  Cardiovascular:     Rate and Rhythm: Regular rhythm.  Neurological:     Mental Status: She is oriented to person, place, and time.  Psychiatric:        Mood and Affect: Mood normal.        Behavior:  Behavior normal.   Review of Systems  Cardiovascular: Negative.   Musculoskeletal: Negative.   Skin: Negative.   Psychiatric/Behavioral:  Negative for depression, hallucinations and suicidal ideas. The patient is not nervous/anxious.   All other systems reviewed and are negative. Blood pressure 119/80, pulse 80, temperature (!) 97.3 F (36.3 C), temperature source Oral, resp. rate 16, SpO2 99 %. There is no height or weight on file to calculate BMI.  Treatment Plan Summary: Daily contact with patient to assess and evaluate symptoms and progress in treatment  Disposition: No evidence of imminent risk to self or others at present.   Recommend psychiatric Inpatient admission when medically cleared. Supportive therapy provided about ongoing stressors. Refer to IOP. Discussed crisis plan, support from social network, calling 911, coming to the Emergency Department, and calling Suicide Hotline.  This service was provided via telemedicine using a 2-way, interactive audio and video technology.  Names of all persons participating in this telemedicine service and their role in this encounter. Name: [facility] Role: patient   Name: T.Dariela Stoker  Role: NP           Oneta Rack, NP 05/30/2021 9:54 AM

## 2021-05-30 NOTE — ED Notes (Signed)
Patient was calm, cooperative and pleasant during the shift. She slept most of the night after taking her medications.

## 2021-05-30 NOTE — Discharge Instructions (Addendum)
It was our pleasure to provide your ER care today - we hope that you feel better.  Follow up with your behavioral health provider today as arranged.   For mental health issues and/or crisis, you may also go directly to the [facility] - it is open 24/7 and walk-ins are welcome.  Return to ER if worse, new symptoms, fevers, trouble breathing, or other concern.

## 2021-05-30 NOTE — ED Provider Notes (Signed)
Emergency Medicine Observation Re-evaluation Note  Kelsey Gray is a 46 y.o. female, seen on rounds today.  Pt initially presented to the ED for complaints of psychiatric evaluation. Pt with hx chronic mental health issues. Is feeling improved today, and has appt with her [facility] provider today. BH team has cleared for ED d/c.   Physical Exam  BP 119/80 (BP Location: Left Arm)   Pulse 80   Temp (!) 97.3 F (36.3 C) (Oral)   Resp 16   SpO2 99%  Physical Exam General: calm, alert.  Cardiac: regular rate Lungs: breathing comfortably.  Psych: normal mood and affect. No SI or thoughts of self harm.  Pt currently does not appear to be responding to internal stimuli - no delusions/hallucinations noted.   ED Course / MDM    I have reviewed the labs performed to date as well as medications administered while in observation.  Recent changes in the last 24 hours include ED obs, bh reassessment.   Plan    [facility] team has reassessed and indicates pt is psych clear for d/c. Pt is agreeable w plan and has f/u today with her [facility] provider:     Pt currently appears stable for d/c.         Cathren Laine, MD 05/30/21 1009

## 2021-12-06 DIAGNOSIS — Z419 Encounter for procedure for purposes other than remedying health state, unspecified: Secondary | ICD-10-CM | POA: Diagnosis not present

## 2021-12-18 DIAGNOSIS — K120 Recurrent oral aphthae: Secondary | ICD-10-CM | POA: Diagnosis not present

## 2021-12-18 DIAGNOSIS — J029 Acute pharyngitis, unspecified: Secondary | ICD-10-CM | POA: Diagnosis not present

## 2022-01-06 DIAGNOSIS — Z419 Encounter for procedure for purposes other than remedying health state, unspecified: Secondary | ICD-10-CM | POA: Diagnosis not present
